# Patient Record
Sex: Female | Born: 1935 | Race: Black or African American | Hispanic: No | Marital: Married
Health system: Southern US, Community
[De-identification: ages and names within clinical notes are randomized; demographics above are authoritative.]

---

## 1999-11-28 ENCOUNTER — Encounter: Payer: Self-pay | Admitting: Internal Medicine

## 1999-11-28 ENCOUNTER — Encounter: Admission: RE | Admit: 1999-11-28 | Discharge: 1999-11-28 | Payer: Self-pay | Admitting: Internal Medicine

## 2000-12-08 ENCOUNTER — Encounter: Admission: RE | Admit: 2000-12-08 | Discharge: 2000-12-08 | Payer: Self-pay | Admitting: Internal Medicine

## 2000-12-08 ENCOUNTER — Encounter: Payer: Self-pay | Admitting: Internal Medicine

## 2000-12-09 ENCOUNTER — Encounter: Admission: RE | Admit: 2000-12-09 | Discharge: 2000-12-09 | Payer: Self-pay | Admitting: Internal Medicine

## 2000-12-09 ENCOUNTER — Encounter: Payer: Self-pay | Admitting: Internal Medicine

## 2000-12-19 ENCOUNTER — Encounter: Admission: RE | Admit: 2000-12-19 | Discharge: 2000-12-19 | Payer: Self-pay | Admitting: Internal Medicine

## 2000-12-19 ENCOUNTER — Encounter: Payer: Self-pay | Admitting: Internal Medicine

## 2001-01-13 ENCOUNTER — Encounter (INDEPENDENT_AMBULATORY_CARE_PROVIDER_SITE_OTHER): Payer: Self-pay

## 2001-01-13 ENCOUNTER — Ambulatory Visit (HOSPITAL_COMMUNITY): Admission: RE | Admit: 2001-01-13 | Discharge: 2001-01-13 | Payer: Self-pay | Admitting: *Deleted

## 2001-12-17 ENCOUNTER — Encounter: Admission: RE | Admit: 2001-12-17 | Discharge: 2001-12-17 | Payer: Self-pay | Admitting: Internal Medicine

## 2001-12-17 ENCOUNTER — Encounter: Payer: Self-pay | Admitting: Internal Medicine

## 2003-01-26 ENCOUNTER — Other Ambulatory Visit: Admission: RE | Admit: 2003-01-26 | Discharge: 2003-01-26 | Payer: Self-pay | Admitting: Internal Medicine

## 2003-01-28 ENCOUNTER — Encounter: Admission: RE | Admit: 2003-01-28 | Discharge: 2003-01-28 | Payer: Self-pay | Admitting: Internal Medicine

## 2003-01-28 ENCOUNTER — Encounter: Payer: Self-pay | Admitting: Internal Medicine

## 2004-02-01 ENCOUNTER — Ambulatory Visit (HOSPITAL_COMMUNITY): Admission: RE | Admit: 2004-02-01 | Discharge: 2004-02-01 | Payer: Self-pay | Admitting: Internal Medicine

## 2004-02-07 ENCOUNTER — Encounter: Admission: RE | Admit: 2004-02-07 | Discharge: 2004-02-07 | Payer: Self-pay | Admitting: Internal Medicine

## 2004-02-07 ENCOUNTER — Encounter (INDEPENDENT_AMBULATORY_CARE_PROVIDER_SITE_OTHER): Payer: Self-pay | Admitting: Specialist

## 2004-12-12 ENCOUNTER — Encounter (INDEPENDENT_AMBULATORY_CARE_PROVIDER_SITE_OTHER): Payer: Self-pay | Admitting: Specialist

## 2004-12-12 ENCOUNTER — Ambulatory Visit (HOSPITAL_COMMUNITY): Admission: RE | Admit: 2004-12-12 | Discharge: 2004-12-12 | Payer: Self-pay | Admitting: Internal Medicine

## 2004-12-18 ENCOUNTER — Encounter: Admission: RE | Admit: 2004-12-18 | Discharge: 2004-12-18 | Payer: Self-pay | Admitting: Internal Medicine

## 2005-03-06 ENCOUNTER — Ambulatory Visit (HOSPITAL_COMMUNITY): Admission: RE | Admit: 2005-03-06 | Discharge: 2005-03-06 | Payer: Self-pay | Admitting: Internal Medicine

## 2005-06-19 ENCOUNTER — Ambulatory Visit (HOSPITAL_COMMUNITY): Admission: RE | Admit: 2005-06-19 | Discharge: 2005-06-19 | Payer: Self-pay | Admitting: Gastroenterology

## 2006-03-31 ENCOUNTER — Ambulatory Visit (HOSPITAL_COMMUNITY): Admission: RE | Admit: 2006-03-31 | Discharge: 2006-03-31 | Payer: Self-pay | Admitting: Internal Medicine

## 2007-04-14 ENCOUNTER — Ambulatory Visit (HOSPITAL_COMMUNITY): Admission: RE | Admit: 2007-04-14 | Discharge: 2007-04-14 | Payer: Self-pay | Admitting: Internal Medicine

## 2008-04-20 ENCOUNTER — Ambulatory Visit (HOSPITAL_COMMUNITY): Admission: RE | Admit: 2008-04-20 | Discharge: 2008-04-20 | Payer: Self-pay | Admitting: Internal Medicine

## 2008-04-29 ENCOUNTER — Encounter: Admission: RE | Admit: 2008-04-29 | Discharge: 2008-04-29 | Payer: Self-pay | Admitting: Internal Medicine

## 2009-04-25 ENCOUNTER — Ambulatory Visit (HOSPITAL_COMMUNITY): Admission: RE | Admit: 2009-04-25 | Discharge: 2009-04-25 | Payer: Self-pay | Admitting: Internal Medicine

## 2010-04-27 ENCOUNTER — Ambulatory Visit (HOSPITAL_COMMUNITY): Admission: RE | Admit: 2010-04-27 | Discharge: 2010-04-27 | Payer: Self-pay | Admitting: Internal Medicine

## 2011-03-29 NOTE — Op Note (Signed)
[name], [name]                 ACCOUNT NO.:  [id number]   MEDICAL RECORD NO.:  [id number]          PATIENT TYPE:  AMB   LOCATION:  ENDO                         FACILITY:  [facility]   PHYSICIAN:  Danise Edge, M.D.   DATE OF BIRTH:  May 09, 1927   DATE OF PROCEDURE:  06/19/2005  DATE OF DISCHARGE:                                 OPERATIVE REPORT   PROCEDURE:  Screening colonoscopy.   REFERRING PHYSICIAN:  Dr. Kirby Funk.   PROCEDURE INDICATIONS:  Ms. Emily Tran is a 77 year old female born Mar 28, 1927. Emily Tran is scheduled to undergo her first screening colonoscopy  with polypectomy to prevent colon cancer. I discussed with her the  complications associated with colonoscopy and polypectomy including a  15/1000 risk of bleeding and 11/998 risk of colon perforation. Emily Tran  has signed the operative permit. Her 2001 health maintenance flexible  proctosigmoidoscopy revealed left colonic diverticulosis but no neoplasia.   ENDOSCOPIST:  Danise Edge, M.D.   PREMEDICATION:  Versed 6 mg, Demerol 60 mg.   DESCRIPTION OF PROCEDURE:  After obtaining informed consent, Emily Tran was  placed in the left lateral decubitus position. I administered intravenous  Demerol and intravenous Versed to achieve conscious sedation for the  procedure. The patient's blood pressure, oxygen saturation and cardiac  rhythm were monitored throughout the procedure and documented in the medical  record.   Anal inspection and digital rectal exam were normal. The Olympus adjustable  pediatric colonoscope was introduced into the rectum and advanced to the  cecum. A normal-appearing ileocecal valve and appendiceal orifice were  identified. Colonic preparation for the exam today was excellent.   RECTUM:  Normal. I was unable to perform a retroflexed view of the distal  rectum.  SIGMOID COLON AND DESCENDING COLON:  Extensive left colonic diverticulosis.  SPLENIC FLEXURE:  Normal.  TRANSVERSE COLON:   Normal.  HEPATIC FLEXURE:  Normal.  ASCENDING COLON:  Normal.  CECUM AND ILEOCECAL VALVE:  Normal.   ASSESSMENT:  Normal screening proctocolonoscopy to the cecum. No endoscopic  evidence for the presence of colorectal neoplasia. Extensive left colonic  diverticulosis present.       MJ/MEDQ  D:  06/19/2005  T:  06/19/2005  Job:  [id number]   cc:   Thora Lance, M.D.  [address]  [place]  [place] [address]  Fax: [phone]

## 2011-03-29 NOTE — Op Note (Signed)
[place]. [facility]  Patient:    Emily Tran, Emily Tran                        MRN: [id number] Proc. Date: 01/13/01 Adm. Date:  [id number] Attending:  Stephenie Acres CC:         Modesta Messing, M.D.   Operative Report  PREOPERATIVE DIAGNOSIS:  Right breast mass.  POSTOPERATIVE DIAGNOSIS:  Right breast mass.  PROCEDURE:  Excisional right breast biopsy.  SURGEON:  Earna Coder, M.D.  ANESTHESIA:  Local MAC.  DESCRIPTION OF PROCEDURE:  The patient was taken to the operating room and placed in a supine position.  After adequate MAC anesthesia was introduced, the right breast was prepped and draped in the normal sterile fashion.  The 3 oclock region of the right breast overlying the mass was anesthetized, the skin and subcutaneous tissue, with 1% lidocaine with bicarbonate.  A curvilinear incision was made, dissected down onto the mass, which was fibrous and fatty in nature.  It was delivered up into the wound and excised in its entirety.  Adequate hemostasis of the cavity was ensured.  The skin was closed with subcuticular 4-0 Monocryl, Steri-Strips, sterile dressings were applied. The patient tolerated the procedure well and went to PACU in good condition. DD:  01/13/01 TD:  01/14/01 Job: [id number]

## 2011-04-05 ENCOUNTER — Other Ambulatory Visit (HOSPITAL_COMMUNITY): Payer: Self-pay | Admitting: Internal Medicine

## 2011-04-05 DIAGNOSIS — Z1231 Encounter for screening mammogram for malignant neoplasm of breast: Secondary | ICD-10-CM

## 2011-04-29 ENCOUNTER — Ambulatory Visit (HOSPITAL_COMMUNITY)
Admission: RE | Admit: 2011-04-29 | Discharge: 2011-04-29 | Disposition: A | Discharge status: Home / Self Care | Payer: Medicare Other | Source: Ambulatory Visit | Attending: Internal Medicine | Admitting: Internal Medicine

## 2011-04-29 DIAGNOSIS — Z1231 Encounter for screening mammogram for malignant neoplasm of breast: Secondary | ICD-10-CM

## 2012-04-13 ENCOUNTER — Other Ambulatory Visit (HOSPITAL_COMMUNITY): Payer: Self-pay | Admitting: Internal Medicine

## 2012-04-13 DIAGNOSIS — Z1231 Encounter for screening mammogram for malignant neoplasm of breast: Secondary | ICD-10-CM

## 2012-05-05 ENCOUNTER — Ambulatory Visit (HOSPITAL_COMMUNITY)
Admission: RE | Admit: 2012-05-05 | Discharge: 2012-05-05 | Disposition: A | Discharge status: Home / Self Care | Payer: Medicare Other | Source: Ambulatory Visit | Attending: Internal Medicine | Admitting: Internal Medicine

## 2012-05-05 DIAGNOSIS — Z1231 Encounter for screening mammogram for malignant neoplasm of breast: Secondary | ICD-10-CM

## 2013-04-09 ENCOUNTER — Other Ambulatory Visit (HOSPITAL_COMMUNITY): Payer: Self-pay | Admitting: Family Medicine

## 2013-04-09 DIAGNOSIS — Z1231 Encounter for screening mammogram for malignant neoplasm of breast: Secondary | ICD-10-CM

## 2013-05-06 ENCOUNTER — Ambulatory Visit (HOSPITAL_COMMUNITY)
Admission: RE | Admit: 2013-05-06 | Discharge: 2013-05-06 | Disposition: A | Discharge status: Home / Self Care | Payer: Medicare Other | Source: Ambulatory Visit | Attending: Family Medicine | Admitting: Family Medicine

## 2013-05-06 DIAGNOSIS — Z1231 Encounter for screening mammogram for malignant neoplasm of breast: Secondary | ICD-10-CM | POA: Insufficient documentation

## 2013-07-27 ENCOUNTER — Other Ambulatory Visit: Payer: Self-pay | Admitting: Family Medicine

## 2013-07-27 DIAGNOSIS — F0390 Unspecified dementia without behavioral disturbance: Secondary | ICD-10-CM

## 2013-08-05 ENCOUNTER — Ambulatory Visit
Admission: RE | Admit: 2013-08-05 | Discharge: 2013-08-05 | Disposition: A | Discharge status: Home / Self Care | Payer: Medicare Other | Source: Ambulatory Visit | Attending: Family Medicine | Admitting: Family Medicine

## 2013-08-05 DIAGNOSIS — F0390 Unspecified dementia without behavioral disturbance: Secondary | ICD-10-CM

## 2014-04-06 ENCOUNTER — Other Ambulatory Visit (HOSPITAL_COMMUNITY): Payer: Self-pay | Admitting: Family Medicine

## 2014-04-06 DIAGNOSIS — Z1231 Encounter for screening mammogram for malignant neoplasm of breast: Secondary | ICD-10-CM

## 2014-05-09 ENCOUNTER — Ambulatory Visit (HOSPITAL_COMMUNITY)
Admission: RE | Admit: 2014-05-09 | Discharge: 2014-05-09 | Disposition: A | Discharge status: Home / Self Care | Payer: Medicare Other | Source: Ambulatory Visit | Attending: Family Medicine | Admitting: Family Medicine

## 2014-05-09 DIAGNOSIS — Z1231 Encounter for screening mammogram for malignant neoplasm of breast: Secondary | ICD-10-CM | POA: Insufficient documentation

## 2015-04-21 ENCOUNTER — Other Ambulatory Visit (HOSPITAL_COMMUNITY): Payer: Self-pay | Admitting: Family Medicine

## 2015-04-21 DIAGNOSIS — Z1231 Encounter for screening mammogram for malignant neoplasm of breast: Secondary | ICD-10-CM

## 2015-05-12 ENCOUNTER — Ambulatory Visit (HOSPITAL_COMMUNITY)
Admission: RE | Admit: 2015-05-12 | Discharge: 2015-05-12 | Disposition: A | Discharge status: Home / Self Care | Payer: Medicare Other | Source: Ambulatory Visit | Attending: Family Medicine | Admitting: Family Medicine

## 2015-05-12 DIAGNOSIS — Z1231 Encounter for screening mammogram for malignant neoplasm of breast: Secondary | ICD-10-CM | POA: Diagnosis present

## 2015-05-16 ENCOUNTER — Other Ambulatory Visit: Payer: Self-pay | Admitting: Family Medicine

## 2015-05-16 DIAGNOSIS — R928 Other abnormal and inconclusive findings on diagnostic imaging of breast: Secondary | ICD-10-CM

## 2015-05-23 ENCOUNTER — Other Ambulatory Visit: Payer: Self-pay | Admitting: Family Medicine

## 2015-05-23 ENCOUNTER — Ambulatory Visit
Admission: RE | Admit: 2015-05-23 | Discharge: 2015-05-23 | Disposition: A | Discharge status: Home / Self Care | Payer: Medicare Other | Source: Ambulatory Visit | Attending: Family Medicine | Admitting: Family Medicine

## 2015-05-23 DIAGNOSIS — R928 Other abnormal and inconclusive findings on diagnostic imaging of breast: Secondary | ICD-10-CM

## 2015-05-30 ENCOUNTER — Ambulatory Visit
Admission: RE | Admit: 2015-05-30 | Discharge: 2015-05-30 | Disposition: A | Discharge status: Home / Self Care | Payer: Medicare Other | Source: Ambulatory Visit | Attending: Family Medicine | Admitting: Family Medicine

## 2015-05-30 ENCOUNTER — Other Ambulatory Visit: Payer: Self-pay | Admitting: Family Medicine

## 2015-05-30 DIAGNOSIS — R928 Other abnormal and inconclusive findings on diagnostic imaging of breast: Secondary | ICD-10-CM

## 2016-05-21 ENCOUNTER — Other Ambulatory Visit: Payer: Self-pay | Admitting: Family Medicine

## 2016-05-21 DIAGNOSIS — Z1231 Encounter for screening mammogram for malignant neoplasm of breast: Secondary | ICD-10-CM

## 2016-05-30 ENCOUNTER — Ambulatory Visit
Admission: RE | Admit: 2016-05-30 | Discharge: 2016-05-30 | Disposition: A | Discharge status: Home / Self Care | Payer: Medicare Other | Source: Ambulatory Visit | Attending: Family Medicine | Admitting: Family Medicine

## 2016-05-30 DIAGNOSIS — Z1231 Encounter for screening mammogram for malignant neoplasm of breast: Secondary | ICD-10-CM

## 2017-02-14 ENCOUNTER — Ambulatory Visit: Payer: Self-pay | Admitting: Podiatry

## 2017-02-20 ENCOUNTER — Ambulatory Visit (INDEPENDENT_AMBULATORY_CARE_PROVIDER_SITE_OTHER): Payer: Medicare Other | Admitting: Podiatry

## 2017-02-20 ENCOUNTER — Encounter: Payer: Self-pay | Admitting: Podiatry

## 2017-02-20 VITALS — Resp 16 | Ht 69.0 in

## 2017-02-20 DIAGNOSIS — B351 Tinea unguium: Secondary | ICD-10-CM | POA: Diagnosis not present

## 2017-02-20 DIAGNOSIS — M79675 Pain in left toe(s): Secondary | ICD-10-CM | POA: Diagnosis not present

## 2017-02-20 DIAGNOSIS — M79674 Pain in right toe(s): Secondary | ICD-10-CM | POA: Diagnosis not present

## 2017-02-20 NOTE — Progress Notes (Signed)
   Subjective:    Patient ID: Emily Tran, female    DOB: July 03, 1927, 83 y.o.   MRN: [id number]  HPI  Chief Complaint  Patient presents with  . Tinea Pedis    BL; concerned about fungus in between toes.  . Nail Problem    Thick; Dark/Discolored nails.    83 year old female presents the office with concerns of thick, painful, elongated toenails that she cannot trim herself. She has also noticed that the nails become dark in color mostly over the right fifth toe. Denies any redness or drainage or swelling around the toenail size. She's had no recent treatment for this. She denies any claudication symptoms. No numbness or tingling. No other complaints at this time.  Review of Systems  All other systems reviewed and are negative.      Objective:   Physical Exam General: AAO x3, NAD  Dermatological: Nails are hypertrophic, dystrophic, brittle, discolored, elongated 10. There is yellow-brown discoloration the nails have the right fifth digit toenail distally to be darker in color. There is no extension of any hyperpigmentation into the surrounding skin. Upon debridement there is no underlying hyperpigmentation in the nailbed. No surrounding redness or drainage. Tenderness nails 1-5 bilaterally. No open lesions or pre-ulcerative lesions are identified today.   Vascular: Dorsalis Pedis artery and Posterior Tibial artery pedal pulses are 2/4 bilateral with immedate capillary fill time. There is no pain with calf compression, swelling, warmth, erythema.   Neruologic: Grossly intact via light touch bilateral. Vibratory intact via tuning fork bilateral. Protective threshold with Semmes Wienstein monofilament intact to all pedal sites bilateral.  Musculoskeletal: No gross boney pedal deformities bilateral. No pain, crepitus, or limitation noted with foot and ankle range of motion bilateral.     Assessment & Plan:  83 year old female with symptomatic onychomycosis -Treatment options discussed  including all alternatives, risks, and complications -Etiology of symptoms were discussed -Nails debrided 10 without complications or bleeding. -I did send the fifth digit toenail on the right side for pathology/culture. -Daily foot inspection -Follow-up in 3 months or sooner if any problems arise. In the meantime, encouraged to call the office with any questions, concerns, change in symptoms.   Ovid Curd, DPM

## 2017-03-10 ENCOUNTER — Telehealth: Payer: Self-pay | Admitting: *Deleted

## 2017-03-10 NOTE — Telephone Encounter (Addendum)
-----   Message from Vivi Barrack, DPM sent at 03/10/2017  7:55 AM EDT ----- Go ahead and please order the shertech topical onychomycosis ointment. Thanks.Left message requesting call back for culture results and instructions.03/12/2017-Pt's son, Arlys John called to get information concerning my call with results. Arlys John states he would like to try but pt has dementia and it is difficult to perform any service for her. I gave Arlys John the [employer] [phone] and told him they would call and explain cost and delivery, that if he found it was too difficult to perform it was not a life or death illness and she could go without it. Arlys John states understanding.

## 2017-03-12 MED ORDER — NONFORMULARY OR COMPOUNDED ITEM: 2 refills | Status: AC

## 2017-05-15 ENCOUNTER — Other Ambulatory Visit: Payer: Self-pay | Admitting: Family Medicine

## 2017-05-15 DIAGNOSIS — Z1231 Encounter for screening mammogram for malignant neoplasm of breast: Secondary | ICD-10-CM

## 2017-05-23 ENCOUNTER — Ambulatory Visit (INDEPENDENT_AMBULATORY_CARE_PROVIDER_SITE_OTHER): Payer: Medicare Other | Admitting: Podiatry

## 2017-05-23 ENCOUNTER — Encounter: Payer: Self-pay | Admitting: Podiatry

## 2017-05-23 DIAGNOSIS — M79674 Pain in right toe(s): Secondary | ICD-10-CM | POA: Diagnosis not present

## 2017-05-23 DIAGNOSIS — B351 Tinea unguium: Secondary | ICD-10-CM

## 2017-05-23 DIAGNOSIS — M79675 Pain in left toe(s): Secondary | ICD-10-CM | POA: Diagnosis not present

## 2017-05-26 NOTE — Progress Notes (Signed)
Subjective: 83 y.o. returns the office today for painful, elongated, thickened toenails which she cannot trim herself. Denies any redness or drainage around the nails. She has been using a topical antifungal that was ordered through [facility]. Denies any acute changes since last appointment and no new complaints today. Denies any systemic complaints such as fevers, chills, nausea, vomiting.   Objective: AAO 3, NAD DP/PT pulses palpable, CRT less than 3 seconds Nails hypertrophic, dystrophic, elongated, brittle, discolored 10. There is tenderness overlying the nails 1-5 bilaterally. There is no surrounding erythema or drainage along the nail sites. No open lesions or pre-ulcerative lesions are identified. No other areas of tenderness bilateral lower extremities. No overlying edema, erythema, increased warmth. No pain with calf compression, swelling, warmth, erythema.  Assessment: Patient presents with symptomatic onychomycosis  Plan: -Treatment options including alternatives, risks, complications were discussed -Nails sharply debrided 10 without complication/bleeding. -Continue a topical antifungal treatment -Discussed daily foot inspection. If there are any changes, to call the office immediately.  -Follow-up in 3 months or sooner if any problems are to arise. In the meantime, encouraged to call the office with any questions, concerns, changes symptoms.  Ovid CurdMatthew Wagoner, DPM

## 2017-06-02 ENCOUNTER — Ambulatory Visit
Admission: RE | Admit: 2017-06-02 | Discharge: 2017-06-02 | Disposition: A | Discharge status: Home / Self Care | Payer: Medicare Other | Source: Ambulatory Visit | Attending: Family Medicine | Admitting: Family Medicine

## 2017-06-02 DIAGNOSIS — Z1231 Encounter for screening mammogram for malignant neoplasm of breast: Secondary | ICD-10-CM

## 2017-08-15 ENCOUNTER — Telehealth: Payer: Self-pay | Admitting: Podiatry

## 2017-08-15 MED ORDER — NONFORMULARY OR COMPOUNDED ITEM: 11 refills | Status: AC

## 2017-08-15 NOTE — Telephone Encounter (Signed)
needs refill on medicine from [facility]

## 2017-08-15 NOTE — Addendum Note (Signed)
Addended by: Alphia Kava D on: 08/15/2017 02:57 PM   Modules accepted: Orders

## 2017-08-15 NOTE — Telephone Encounter (Addendum)
Dr. Ardelle Anton ordered refill Shertech Onychomycosis Nail Lacquer prn 1 year. Faxed refill request to [facility]. Left message informing pt's son, Arlys John of the refill orders.

## 2017-08-21 ENCOUNTER — Ambulatory Visit: Payer: Medicare Other | Admitting: Podiatry

## 2017-11-17 ENCOUNTER — Ambulatory Visit: Payer: Medicare Other | Admitting: Podiatry

## 2018-11-25 ENCOUNTER — Other Ambulatory Visit: Payer: Self-pay | Admitting: Family Medicine

## 2018-11-25 DIAGNOSIS — R5381 Other malaise: Secondary | ICD-10-CM

## 2018-11-26 ENCOUNTER — Other Ambulatory Visit: Payer: Self-pay | Admitting: Family Medicine

## 2018-11-26 DIAGNOSIS — M8580 Other specified disorders of bone density and structure, unspecified site: Secondary | ICD-10-CM

## 2019-01-25 ENCOUNTER — Other Ambulatory Visit: Payer: Self-pay | Admitting: Family Medicine

## 2019-01-25 DIAGNOSIS — Z1231 Encounter for screening mammogram for malignant neoplasm of breast: Secondary | ICD-10-CM

## 2019-01-28 ENCOUNTER — Other Ambulatory Visit: Payer: Medicare Other

## 2019-03-23 ENCOUNTER — Other Ambulatory Visit: Payer: Medicare Other

## 2019-04-29 ENCOUNTER — Ambulatory Visit
Admission: RE | Admit: 2019-04-29 | Discharge: 2019-04-29 | Disposition: A | Discharge status: Home / Self Care | Payer: Medicare Other | Source: Ambulatory Visit | Attending: Family Medicine | Admitting: Family Medicine

## 2019-04-29 ENCOUNTER — Other Ambulatory Visit: Payer: Self-pay

## 2019-04-29 DIAGNOSIS — M8580 Other specified disorders of bone density and structure, unspecified site: Secondary | ICD-10-CM

## 2019-04-29 DIAGNOSIS — Z1231 Encounter for screening mammogram for malignant neoplasm of breast: Secondary | ICD-10-CM

## 2019-05-03 ENCOUNTER — Ambulatory Visit: Payer: Medicare Other | Admitting: Podiatry

## 2019-05-03 ENCOUNTER — Other Ambulatory Visit: Payer: Self-pay

## 2019-05-03 DIAGNOSIS — M79674 Pain in right toe(s): Secondary | ICD-10-CM

## 2019-05-03 DIAGNOSIS — B351 Tinea unguium: Secondary | ICD-10-CM | POA: Diagnosis not present

## 2019-05-03 DIAGNOSIS — M79675 Pain in left toe(s): Secondary | ICD-10-CM

## 2019-05-03 MED ORDER — NAFTIFINE HCL 1 % EX CREA: TOPICAL_CREAM | Freq: Every day | CUTANEOUS | 0 refills | Status: DC

## 2019-05-04 NOTE — Progress Notes (Signed)
Subjective: 85 y.o. returns the office today for painful, elongated, thickened toenails which she cannot trim herself.  She was using a topical compound ointment for nail fungus however was not overly helpful.  Her son is requesting a refill of Naftin that he brought into the office today because she does get some athlete's foot that he would like to have a refill. Denies any systemic complaints such as fevers, chills, nausea, vomiting.   Objective: AAO 3, NAD DP/PT pulses palpable, CRT less than 3 seconds Nails hypertrophic, dystrophic, elongated, brittle, discolored 10. There is tenderness overlying the nails 1-5 bilaterally. There is no surrounding erythema or drainage along the nail sites. Very minimal tinea pedis is present interdigitally.  There is no open sores there is no drainage or pus or any signs of a bacterial infection. No open lesions or pre-ulcerative lesions are identified. No pain with calf compression, swelling, warmth, erythema.  Assessment: Patient presents with symptomatic onychomycosis  Plan: -Treatment options including alternatives, risks, complications were discussed -Nails sharply debrided 10 without complication/bleeding. -Prescribed Naftin for him. -Discussed daily foot inspection. If there are any changes, to call the office immediately.  -Follow-up in 3 months or sooner if any problems are to arise. In the meantime, encouraged to call the office with any questions, concerns, changes symptoms.  Celesta Gentile, DPM

## 2019-08-02 ENCOUNTER — Ambulatory Visit (INDEPENDENT_AMBULATORY_CARE_PROVIDER_SITE_OTHER): Payer: Medicare Other | Admitting: Podiatry

## 2019-08-02 ENCOUNTER — Encounter: Payer: Self-pay | Admitting: Podiatry

## 2019-08-02 ENCOUNTER — Other Ambulatory Visit: Payer: Self-pay

## 2019-08-02 DIAGNOSIS — B351 Tinea unguium: Secondary | ICD-10-CM | POA: Diagnosis not present

## 2019-08-02 DIAGNOSIS — M79675 Pain in left toe(s): Secondary | ICD-10-CM

## 2019-08-02 DIAGNOSIS — Q828 Other specified congenital malformations of skin: Secondary | ICD-10-CM

## 2019-08-02 DIAGNOSIS — M79674 Pain in right toe(s): Secondary | ICD-10-CM | POA: Diagnosis not present

## 2019-08-02 MED ORDER — NAFTIFINE HCL 1 % EX CREA: TOPICAL_CREAM | Freq: Every day | CUTANEOUS | 2 refills | Status: DC

## 2019-08-03 NOTE — Progress Notes (Signed)
Subjective: 85 y.o. returns the office today for painful, elongated, thickened toenails which she cannot trim herself as well as for calluses to both of her feet.  Denies any redness or drainage from the callus or toenail sites.  They are asking for refill of Naftin as this has been helpful for her.  She gets occasional athlete's foot.  Denies any skin breakdown or open sores or any pustules or blistering.  Objective: AAO 3, NAD DP/PT pulses palpable, CRT less than 3 seconds Nails hypertrophic, dystrophic, elongated, brittle, discolored 10. There is tenderness overlying the nails 1-5 bilaterally. There is no surrounding erythema or drainage along the nail sites. Hyperkeratotic lesion submetatarsal bilaterally x4 total.  No known ulceration drainage or any signs of infection. Tinea pedis appears to be resolved No open lesions or pre-ulcerative lesions are identified. No pain with calf compression, swelling, warmth, erythema.  Assessment: Patient presents with symptomatic onychomycosis; hyperkeratotic lesions  Plan: -Treatment options including alternatives, risks, complications were discussed -Nails sharply debrided 10 without complication/bleeding. -Hyperkeratotic lesions debrided x4 without any complications or bleeding -Refilled Naftin cream. -Discussed daily foot inspection. If there are any changes, to call the office immediately.  -Follow-up in 3 months or sooner if any problems are to arise. In the meantime, encouraged to call the office with any questions, concerns, changes symptoms.  Celesta Gentile, DPM

## 2019-11-01 ENCOUNTER — Ambulatory Visit: Payer: Medicare Other | Admitting: Podiatry

## 2019-11-01 ENCOUNTER — Other Ambulatory Visit: Payer: Self-pay

## 2019-11-01 DIAGNOSIS — B351 Tinea unguium: Secondary | ICD-10-CM

## 2019-11-01 DIAGNOSIS — M79674 Pain in right toe(s): Secondary | ICD-10-CM

## 2019-11-01 DIAGNOSIS — M79675 Pain in left toe(s): Secondary | ICD-10-CM

## 2019-11-01 DIAGNOSIS — Q828 Other specified congenital malformations of skin: Secondary | ICD-10-CM | POA: Diagnosis not present

## 2019-11-01 NOTE — Patient Instructions (Signed)
Look at getting Eucerin or Cetaphil cream for the feet. Do not apply between the toes.

## 2019-11-07 NOTE — Progress Notes (Signed)
Subjective: 85 y.o. returns the office today for painful, elongated, thickened toenails which she cannot trim herself as well as for calluses to both of her feet.  Denies any skin breakdown or open sores or any pustules or blistering.  Objective: AAO 3, NAD DP/PT pulses palpable, CRT less than 3 seconds Nails hypertrophic, dystrophic, elongated, brittle, discolored 10. There is tenderness overlying the nails 1-5 bilaterally. There is no surrounding erythema or drainage along the nail sites. Hyperkeratotic lesion submetatarsal bilaterally x4 total.  No known ulceration drainage or any signs of infection. Tinea pedis appears to be resolved No open lesions or pre-ulcerative lesions are identified. No pain with calf compression, swelling, warmth, erythema.  Assessment: Patient presents with symptomatic onychomycosis; hyperkeratotic lesions  Plan: -Treatment options including alternatives, risks, complications were discussed -Nails sharply debrided 10 without complication/bleeding. -Hyperkeratotic lesions debrided x4 without any complications or bleeding -She does have dry skin without any skin fissures.  Recommend Eucerin instead of her moisturizer but not apply interdigitally. -Discussed daily foot inspection. If there are any changes, to call the office immediately.  -Follow-up in 3 months or sooner if any problems are to arise. In the meantime, encouraged to call the office with any questions, concerns, changes symptoms.  Celesta Gentile, DPM

## 2020-01-31 ENCOUNTER — Ambulatory Visit: Payer: Medicare Other | Admitting: Podiatry

## 2020-01-31 ENCOUNTER — Other Ambulatory Visit: Payer: Self-pay

## 2020-01-31 ENCOUNTER — Encounter: Payer: Self-pay | Admitting: Podiatry

## 2020-01-31 VITALS — Temp 97.7°F

## 2020-01-31 DIAGNOSIS — B351 Tinea unguium: Secondary | ICD-10-CM

## 2020-01-31 DIAGNOSIS — Q828 Other specified congenital malformations of skin: Secondary | ICD-10-CM

## 2020-01-31 DIAGNOSIS — M79675 Pain in left toe(s): Secondary | ICD-10-CM | POA: Diagnosis not present

## 2020-01-31 DIAGNOSIS — M79674 Pain in right toe(s): Secondary | ICD-10-CM

## 2020-01-31 MED ORDER — NAFTIFINE HCL 1 % EX CREA: TOPICAL_CREAM | Freq: Every day | CUTANEOUS | 2 refills | Status: DC

## 2020-02-01 NOTE — Progress Notes (Signed)
Subjective: 86 y.o. returns the office today for painful, elongated, thickened toenails which she cannot trim herself as well as for calluses to both of her feet.  Denies any skin breakdown or open sores or any pustules or blistering.  Objective: AAO 3, NAD DP/PT pulses palpable, CRT less than 3 seconds Nails hypertrophic, dystrophic, elongated, brittle, discolored 10. There is tenderness overlying the nails 1-5 bilaterally. There is no surrounding erythema or drainage along the nail sites. Hyperkeratotic lesion submetatarsal bilaterally x4 total.  No known ulceration drainage or any signs of infection. Prominent metatarsal heads plantarly.  Hammertoe contractures present. No open lesions or pre-ulcerative lesions are identified. No pain with calf compression, swelling, warmth, erythema.  Assessment: Patient presents with symptomatic onychomycosis; hyperkeratotic lesions  Plan: -Treatment options including alternatives, risks, complications were discussed -Nails sharply debrided 10 without complication/bleeding. -Hyperkeratotic lesions debrided x4 without any complications or bleeding -Asking for refill Naftin to have on hand.  I did refill this. -Discussed daily foot inspection. If there are any changes, to call the office immediately.  -Follow-up in 3 months or sooner if any problems are to arise. In the meantime, encouraged to call the office with any questions, concerns, changes symptoms.  Ovid Curd, DPM

## 2020-03-27 ENCOUNTER — Other Ambulatory Visit: Payer: Self-pay | Admitting: Family Medicine

## 2020-03-27 DIAGNOSIS — Z1231 Encounter for screening mammogram for malignant neoplasm of breast: Secondary | ICD-10-CM

## 2020-05-01 ENCOUNTER — Other Ambulatory Visit: Payer: Self-pay

## 2020-05-01 ENCOUNTER — Ambulatory Visit
Admission: RE | Admit: 2020-05-01 | Discharge: 2020-05-01 | Disposition: A | Discharge status: Home / Self Care | Payer: Medicare Other | Source: Ambulatory Visit | Attending: Family Medicine | Admitting: Family Medicine

## 2020-05-01 DIAGNOSIS — Z1231 Encounter for screening mammogram for malignant neoplasm of breast: Secondary | ICD-10-CM

## 2020-05-02 ENCOUNTER — Ambulatory Visit: Payer: Medicare Other | Admitting: Podiatry

## 2020-07-10 ENCOUNTER — Encounter: Payer: Self-pay | Admitting: Podiatry

## 2020-07-10 ENCOUNTER — Other Ambulatory Visit: Payer: Self-pay

## 2020-07-10 ENCOUNTER — Ambulatory Visit: Payer: Medicare Other | Admitting: Podiatry

## 2020-07-10 DIAGNOSIS — B351 Tinea unguium: Secondary | ICD-10-CM | POA: Diagnosis not present

## 2020-07-10 DIAGNOSIS — M79674 Pain in right toe(s): Secondary | ICD-10-CM

## 2020-07-10 DIAGNOSIS — Q828 Other specified congenital malformations of skin: Secondary | ICD-10-CM

## 2020-07-10 DIAGNOSIS — M79675 Pain in left toe(s): Secondary | ICD-10-CM

## 2020-07-11 NOTE — Progress Notes (Signed)
Subjective: 86 y.o. returns the office today for painful, elongated, thickened toenails which she cannot trim herself as well as for calluses to both of her feet.  Her son states the calluses in the right foot to become thicker.  They have been causing more discomfort.  Denies any skin breakdown or open sores or any pustules or blistering.  Objective: NAD DP/PT pulses palpable, CRT less than 3 seconds Nails hypertrophic, dystrophic, elongated, brittle, discolored 10. There is tenderness overlying the nails 1-5 bilaterally. There is no surrounding erythema or drainage along the nail sites. Hyperkeratotic lesion submetatarsal bilaterally x4 total.  No known ulceration drainage or any signs of infection. Prominent metatarsal heads plantarly.  Hammertoe contractures present. No open lesions or pre-ulcerative lesions are identified. No pain with calf compression, swelling, warmth, erythema.  Assessment: Patient presents with symptomatic onychomycosis; hyperkeratotic lesions  Plan: -Treatment options including alternatives, risks, complications were discussed -Nails sharply debrided 10 without complication/bleeding. -Hyperkeratotic lesions debrided x4 without any complications or bleeding -Discussed daily foot inspection. If there are any changes, to call the office immediately.  -Follow-up in 3 months or sooner if any problems are to arise. In the meantime, encouraged to call the office with any questions, concerns, changes symptoms.  Ovid Curd, DPM

## 2020-10-12 ENCOUNTER — Ambulatory Visit: Payer: Medicare Other | Admitting: Podiatry

## 2020-10-12 ENCOUNTER — Other Ambulatory Visit: Payer: Self-pay

## 2020-10-12 DIAGNOSIS — B351 Tinea unguium: Secondary | ICD-10-CM

## 2020-10-12 DIAGNOSIS — Q828 Other specified congenital malformations of skin: Secondary | ICD-10-CM

## 2020-10-12 DIAGNOSIS — M79675 Pain in left toe(s): Secondary | ICD-10-CM

## 2020-10-12 DIAGNOSIS — M79674 Pain in right toe(s): Secondary | ICD-10-CM | POA: Diagnosis not present

## 2020-10-12 NOTE — Progress Notes (Signed)
Subjective: 86 y.o. returns the office today for painful, elongated, thickened toenails which she cannot trim herself as well as for calluses to both of her feet.  Her son states the calluses in the right foot to become thicker and the right foot is more painful that the right in regards to the callus. No open lesions. Denies any skin breakdown or open sores or any pustules or blistering.  Objective: NAD DP/PT pulses palpable, CRT less than 3 seconds Nails hypertrophic, dystrophic, elongated, brittle, discolored 10. There is tenderness overlying the nails 1-5 bilaterally. There is no surrounding erythema or drainage along the nail sites. Hyperkeratotic lesion submetatarsal bilaterally x4 total.  No known ulceration drainage or any signs of infection. Prominent metatarsal heads plantarly.  Hammertoe contractures present. No open lesions or pre-ulcerative lesions are identified. No pain with calf compression, swelling, warmth, erythema.  Assessment: Patient presents with symptomatic onychomycosis; hyperkeratotic lesions  Plan: -Treatment options including alternatives, risks, complications were discussed -Nails sharply debrided 10 without complication/bleeding. -Hyperkeratotic lesions debrided x4 without any complications or bleeding. Dispensed gel metatarsal pads. Moisturizer daily.  -Discussed daily foot inspection. If there are any changes, to call the office immediately.  -Follow-up in 3 months or sooner if any problems are to arise. In the meantime, encouraged to call the office with any questions, concerns, changes symptoms.  Ovid Curd, DPM

## 2020-12-05 DIAGNOSIS — I129 Hypertensive chronic kidney disease with stage 1 through stage 4 chronic kidney disease, or unspecified chronic kidney disease: Secondary | ICD-10-CM | POA: Diagnosis not present

## 2020-12-05 DIAGNOSIS — E785 Hyperlipidemia, unspecified: Secondary | ICD-10-CM | POA: Diagnosis not present

## 2020-12-05 DIAGNOSIS — M810 Age-related osteoporosis without current pathological fracture: Secondary | ICD-10-CM | POA: Diagnosis not present

## 2020-12-05 DIAGNOSIS — N183 Chronic kidney disease, stage 3 unspecified: Secondary | ICD-10-CM | POA: Diagnosis not present

## 2020-12-05 DIAGNOSIS — I10 Essential (primary) hypertension: Secondary | ICD-10-CM | POA: Diagnosis not present

## 2020-12-11 DIAGNOSIS — J3089 Other allergic rhinitis: Secondary | ICD-10-CM | POA: Diagnosis not present

## 2021-01-08 DIAGNOSIS — E785 Hyperlipidemia, unspecified: Secondary | ICD-10-CM | POA: Diagnosis not present

## 2021-01-08 DIAGNOSIS — M810 Age-related osteoporosis without current pathological fracture: Secondary | ICD-10-CM | POA: Diagnosis not present

## 2021-01-08 DIAGNOSIS — I129 Hypertensive chronic kidney disease with stage 1 through stage 4 chronic kidney disease, or unspecified chronic kidney disease: Secondary | ICD-10-CM | POA: Diagnosis not present

## 2021-01-08 DIAGNOSIS — N183 Chronic kidney disease, stage 3 unspecified: Secondary | ICD-10-CM | POA: Diagnosis not present

## 2021-01-08 DIAGNOSIS — I10 Essential (primary) hypertension: Secondary | ICD-10-CM | POA: Diagnosis not present

## 2021-01-09 ENCOUNTER — Ambulatory Visit: Payer: Medicare Other | Admitting: Podiatry

## 2021-01-09 ENCOUNTER — Other Ambulatory Visit: Payer: Self-pay

## 2021-01-09 DIAGNOSIS — Q828 Other specified congenital malformations of skin: Secondary | ICD-10-CM

## 2021-01-09 DIAGNOSIS — M79674 Pain in right toe(s): Secondary | ICD-10-CM

## 2021-01-09 DIAGNOSIS — M79675 Pain in left toe(s): Secondary | ICD-10-CM

## 2021-01-09 DIAGNOSIS — B351 Tinea unguium: Secondary | ICD-10-CM | POA: Diagnosis not present

## 2021-01-10 NOTE — Progress Notes (Signed)
Subjective: 87 y.o. returns the office today for painful, elongated, thickened toenails which she cannot trim herself as well as for calluses to both of her feet.  She has recently purchased new shoes which have been helpful. No open lesions. Denies any skin breakdown or open sores or any pustules or blistering.  Objective: NAD DP/PT pulses palpable, CRT less than 3 seconds Nails hypertrophic, dystrophic, elongated, brittle, discolored 10. There is tenderness overlying the nails 1-5 bilaterally. There is no surrounding erythema or drainage along the nail sites. Hyperkeratotic lesion submetatarsal bilaterally x4 total.  No underlying ulceration drainage or any signs of infection. Prominent metatarsal heads plantarly.  Hammertoe contractures present. No open lesions or pre-ulcerative lesions are identified. No pain with calf compression, swelling, warmth, erythema.  Assessment: Patient presents with symptomatic onychomycosis; hyperkeratotic lesions  Plan: -Treatment options including alternatives, risks, complications were discussed -Nails sharply debrided 10 without complication/bleeding. -Hyperkeratotic lesions debrided x4 without any complications or bleeding. Dispensed gel metatarsal pads. Moisturizer daily.  -Discussed daily foot inspection. If there are any changes, to call the office immediately.  -Follow-up in 3 months or sooner if any problems are to arise. In the meantime, encouraged to call the office with any questions, concerns, changes symptoms.  Ovid Curd, DPM

## 2021-01-29 DIAGNOSIS — R400 Somnolence: Secondary | ICD-10-CM | POA: Diagnosis not present

## 2021-01-29 DIAGNOSIS — R0982 Postnasal drip: Secondary | ICD-10-CM | POA: Diagnosis not present

## 2021-02-01 DIAGNOSIS — J3089 Other allergic rhinitis: Secondary | ICD-10-CM | POA: Diagnosis not present

## 2021-02-01 DIAGNOSIS — I10 Essential (primary) hypertension: Secondary | ICD-10-CM | POA: Diagnosis not present

## 2021-02-01 DIAGNOSIS — R400 Somnolence: Secondary | ICD-10-CM | POA: Diagnosis not present

## 2021-02-01 DIAGNOSIS — H401194 Primary open-angle glaucoma, unspecified eye, indeterminate stage: Secondary | ICD-10-CM | POA: Diagnosis not present

## 2021-02-01 DIAGNOSIS — M810 Age-related osteoporosis without current pathological fracture: Secondary | ICD-10-CM | POA: Diagnosis not present

## 2021-02-01 DIAGNOSIS — E785 Hyperlipidemia, unspecified: Secondary | ICD-10-CM | POA: Diagnosis not present

## 2021-02-01 DIAGNOSIS — R739 Hyperglycemia, unspecified: Secondary | ICD-10-CM | POA: Diagnosis not present

## 2021-02-01 DIAGNOSIS — N183 Chronic kidney disease, stage 3 unspecified: Secondary | ICD-10-CM | POA: Diagnosis not present

## 2021-02-21 DIAGNOSIS — I129 Hypertensive chronic kidney disease with stage 1 through stage 4 chronic kidney disease, or unspecified chronic kidney disease: Secondary | ICD-10-CM | POA: Diagnosis not present

## 2021-02-21 DIAGNOSIS — N183 Chronic kidney disease, stage 3 unspecified: Secondary | ICD-10-CM | POA: Diagnosis not present

## 2021-02-21 DIAGNOSIS — I10 Essential (primary) hypertension: Secondary | ICD-10-CM | POA: Diagnosis not present

## 2021-02-21 DIAGNOSIS — M810 Age-related osteoporosis without current pathological fracture: Secondary | ICD-10-CM | POA: Diagnosis not present

## 2021-02-21 DIAGNOSIS — E785 Hyperlipidemia, unspecified: Secondary | ICD-10-CM | POA: Diagnosis not present

## 2021-03-01 DIAGNOSIS — J3089 Other allergic rhinitis: Secondary | ICD-10-CM | POA: Diagnosis not present

## 2021-03-01 DIAGNOSIS — R739 Hyperglycemia, unspecified: Secondary | ICD-10-CM | POA: Diagnosis not present

## 2021-03-01 DIAGNOSIS — M170 Bilateral primary osteoarthritis of knee: Secondary | ICD-10-CM | POA: Diagnosis not present

## 2021-03-01 DIAGNOSIS — E785 Hyperlipidemia, unspecified: Secondary | ICD-10-CM | POA: Diagnosis not present

## 2021-03-01 DIAGNOSIS — J301 Allergic rhinitis due to pollen: Secondary | ICD-10-CM | POA: Diagnosis not present

## 2021-03-01 DIAGNOSIS — N183 Chronic kidney disease, stage 3 unspecified: Secondary | ICD-10-CM | POA: Diagnosis not present

## 2021-03-01 DIAGNOSIS — M810 Age-related osteoporosis without current pathological fracture: Secondary | ICD-10-CM | POA: Diagnosis not present

## 2021-03-01 DIAGNOSIS — R400 Somnolence: Secondary | ICD-10-CM | POA: Diagnosis not present

## 2021-03-23 DIAGNOSIS — N183 Chronic kidney disease, stage 3 unspecified: Secondary | ICD-10-CM | POA: Diagnosis not present

## 2021-03-23 DIAGNOSIS — M170 Bilateral primary osteoarthritis of knee: Secondary | ICD-10-CM | POA: Diagnosis not present

## 2021-03-23 DIAGNOSIS — M810 Age-related osteoporosis without current pathological fracture: Secondary | ICD-10-CM | POA: Diagnosis not present

## 2021-03-23 DIAGNOSIS — I129 Hypertensive chronic kidney disease with stage 1 through stage 4 chronic kidney disease, or unspecified chronic kidney disease: Secondary | ICD-10-CM | POA: Diagnosis not present

## 2021-03-23 DIAGNOSIS — I10 Essential (primary) hypertension: Secondary | ICD-10-CM | POA: Diagnosis not present

## 2021-03-23 DIAGNOSIS — E785 Hyperlipidemia, unspecified: Secondary | ICD-10-CM | POA: Diagnosis not present

## 2021-04-03 ENCOUNTER — Other Ambulatory Visit: Payer: Self-pay | Admitting: Family Medicine

## 2021-04-03 DIAGNOSIS — Z1231 Encounter for screening mammogram for malignant neoplasm of breast: Secondary | ICD-10-CM

## 2021-04-12 ENCOUNTER — Other Ambulatory Visit: Payer: Self-pay

## 2021-04-12 ENCOUNTER — Ambulatory Visit: Payer: Medicare Other | Admitting: Podiatry

## 2021-04-12 DIAGNOSIS — M79675 Pain in left toe(s): Secondary | ICD-10-CM

## 2021-04-12 DIAGNOSIS — Q828 Other specified congenital malformations of skin: Secondary | ICD-10-CM | POA: Diagnosis not present

## 2021-04-12 DIAGNOSIS — M79674 Pain in right toe(s): Secondary | ICD-10-CM | POA: Diagnosis not present

## 2021-04-12 DIAGNOSIS — B351 Tinea unguium: Secondary | ICD-10-CM | POA: Diagnosis not present

## 2021-04-17 NOTE — Progress Notes (Signed)
Subjective: 87 y.o. returns the office today for painful, elongated, thickened toenails which she cannot trim herself as well as for calluses to both of her feet.  She has recently purchased new shoes which have been helpful.  Her son also tries to keep moisturizer on the calluses.  No open lesions. Denies any skin breakdown or open sores or any pustules or blistering.  Objective: NAD DP/PT pulses palpable, CRT less than 3 seconds Nails hypertrophic, dystrophic, elongated, brittle, discolored 10. There is tenderness overlying the nails 1-5 bilaterally. There is no surrounding erythema or drainage along the nail sites. Hyperkeratotic lesion submetatarsal bilaterally x4 total.  No underlying ulceration drainage or any signs of infection. Prominent metatarsal heads plantarly.  Hammertoe contractures present. No open lesions or pre-ulcerative lesions are identified. No pain with calf compression, swelling, warmth, erythema.  Assessment: Patient presents with symptomatic onychomycosis; hyperkeratotic lesions  Plan: -Treatment options including alternatives, risks, complications were discussed -Nails sharply debrided 10 without complication/bleeding. -Hyperkeratotic lesions debrided x4 without any complications or bleeding.  Continue offloading. -Discussed daily foot inspection. If there are any changes, to call the office immediately.  -Follow-up in 3 months or sooner if any problems are to arise. In the meantime, encouraged to call the office with any questions, concerns, changes symptoms.  Ovid Curd, DPM

## 2021-05-04 DIAGNOSIS — M810 Age-related osteoporosis without current pathological fracture: Secondary | ICD-10-CM | POA: Diagnosis not present

## 2021-05-04 DIAGNOSIS — I129 Hypertensive chronic kidney disease with stage 1 through stage 4 chronic kidney disease, or unspecified chronic kidney disease: Secondary | ICD-10-CM | POA: Diagnosis not present

## 2021-05-04 DIAGNOSIS — I10 Essential (primary) hypertension: Secondary | ICD-10-CM | POA: Diagnosis not present

## 2021-05-04 DIAGNOSIS — E785 Hyperlipidemia, unspecified: Secondary | ICD-10-CM | POA: Diagnosis not present

## 2021-05-04 DIAGNOSIS — M170 Bilateral primary osteoarthritis of knee: Secondary | ICD-10-CM | POA: Diagnosis not present

## 2021-06-01 ENCOUNTER — Other Ambulatory Visit: Payer: Self-pay

## 2021-06-01 ENCOUNTER — Ambulatory Visit
Admission: RE | Admit: 2021-06-01 | Discharge: 2021-06-01 | Disposition: A | Discharge status: Home / Self Care | Payer: Medicare Other | Source: Ambulatory Visit | Attending: Family Medicine | Admitting: Family Medicine

## 2021-06-01 DIAGNOSIS — Z1231 Encounter for screening mammogram for malignant neoplasm of breast: Secondary | ICD-10-CM

## 2021-06-06 DIAGNOSIS — I10 Essential (primary) hypertension: Secondary | ICD-10-CM | POA: Diagnosis not present

## 2021-06-06 DIAGNOSIS — M170 Bilateral primary osteoarthritis of knee: Secondary | ICD-10-CM | POA: Diagnosis not present

## 2021-06-06 DIAGNOSIS — M810 Age-related osteoporosis without current pathological fracture: Secondary | ICD-10-CM | POA: Diagnosis not present

## 2021-06-26 DIAGNOSIS — F5101 Primary insomnia: Secondary | ICD-10-CM | POA: Diagnosis not present

## 2021-06-26 DIAGNOSIS — N183 Chronic kidney disease, stage 3 unspecified: Secondary | ICD-10-CM | POA: Diagnosis not present

## 2021-06-26 DIAGNOSIS — I10 Essential (primary) hypertension: Secondary | ICD-10-CM | POA: Diagnosis not present

## 2021-06-26 DIAGNOSIS — M810 Age-related osteoporosis without current pathological fracture: Secondary | ICD-10-CM | POA: Diagnosis not present

## 2021-06-26 DIAGNOSIS — H401194 Primary open-angle glaucoma, unspecified eye, indeterminate stage: Secondary | ICD-10-CM | POA: Diagnosis not present

## 2021-06-26 DIAGNOSIS — E785 Hyperlipidemia, unspecified: Secondary | ICD-10-CM | POA: Diagnosis not present

## 2021-06-26 DIAGNOSIS — R739 Hyperglycemia, unspecified: Secondary | ICD-10-CM | POA: Diagnosis not present

## 2021-07-19 ENCOUNTER — Other Ambulatory Visit: Payer: Self-pay

## 2021-07-19 ENCOUNTER — Ambulatory Visit: Payer: Medicare Other | Admitting: Podiatry

## 2021-07-19 DIAGNOSIS — M79675 Pain in left toe(s): Secondary | ICD-10-CM | POA: Diagnosis not present

## 2021-07-19 DIAGNOSIS — B351 Tinea unguium: Secondary | ICD-10-CM

## 2021-07-19 DIAGNOSIS — Q828 Other specified congenital malformations of skin: Secondary | ICD-10-CM | POA: Diagnosis not present

## 2021-07-19 DIAGNOSIS — M79674 Pain in right toe(s): Secondary | ICD-10-CM | POA: Diagnosis not present

## 2021-07-20 NOTE — Progress Notes (Signed)
Subjective: 87 y.o. returns the office today for painful, elongated, thickened toenails which she cannot trim herself as well as for calluses to both of her feet. Her son also tries to keep moisturizer on the calluses and tries to file it down gently to help it from not building up as much.  No open lesions. Denies any skin breakdown or open sores or any pustules or blistering.  Objective: NAD DP/PT pulses palpable, CRT less than 3 seconds Nails hypertrophic, dystrophic, elongated, brittle, discolored 10. There is tenderness overlying the nails 1-5 bilaterally. There is no surrounding erythema or drainage along the nail sites. Hyperkeratotic lesion submetatarsal bilaterally x4 total.  No underlying ulceration drainage or any signs of infection. Prominent metatarsal heads plantarly.  Hammertoe contractures present. No open lesions or pre-ulcerative lesions are identified. No pain with calf compression, swelling, warmth, erythema.  Assessment: Patient presents with symptomatic onychomycosis; hyperkeratotic lesions  Plan: -Treatment options including alternatives, risks, complications were discussed -Nails sharply debrided 10 without complication/bleeding. -Hyperkeratotic lesions debrided x4 without any complications or bleeding.  Continue offloading. -Discussed daily foot inspection. If there are any changes, to call the office immediately.  -Follow-up in 3 months or sooner if any problems are to arise. In the meantime, encouraged to call the office with any questions, concerns, changes symptoms.  Ovid Curd, DPM

## 2021-08-20 ENCOUNTER — Telehealth: Payer: Self-pay | Admitting: Podiatry

## 2021-08-20 NOTE — Telephone Encounter (Signed)
Patients son called to follow up on the topical cream that was suppose to be sent to his mother pharmacy

## 2021-08-21 ENCOUNTER — Other Ambulatory Visit: Payer: Self-pay | Admitting: Podiatry

## 2021-08-21 NOTE — Telephone Encounter (Signed)
I called and left a message for the patient's son and stated to call the [facility] office tomorrow. Misty Stanley

## 2021-08-21 NOTE — Progress Notes (Signed)
error 

## 2021-08-22 ENCOUNTER — Other Ambulatory Visit: Payer: Self-pay | Admitting: Podiatry

## 2021-08-22 ENCOUNTER — Telehealth: Payer: Self-pay | Admitting: *Deleted

## 2021-08-22 MED ORDER — NAFTIFINE HCL 1 % EX CREA: TOPICAL_CREAM | Freq: Every day | CUTANEOUS | 2 refills | Status: DC

## 2021-08-22 NOTE — Telephone Encounter (Signed)
Returned the call to patient's son,no answer,left vmessage that medication has been sent to pharmacy.

## 2021-08-22 NOTE — Telephone Encounter (Signed)
Patient's son is calling to request a refill of the Naftifine-1% cream. Please advise.

## 2021-09-07 DIAGNOSIS — M170 Bilateral primary osteoarthritis of knee: Secondary | ICD-10-CM | POA: Diagnosis not present

## 2021-09-07 DIAGNOSIS — N183 Chronic kidney disease, stage 3 unspecified: Secondary | ICD-10-CM | POA: Diagnosis not present

## 2021-09-07 DIAGNOSIS — I129 Hypertensive chronic kidney disease with stage 1 through stage 4 chronic kidney disease, or unspecified chronic kidney disease: Secondary | ICD-10-CM | POA: Diagnosis not present

## 2021-09-07 DIAGNOSIS — M810 Age-related osteoporosis without current pathological fracture: Secondary | ICD-10-CM | POA: Diagnosis not present

## 2021-09-07 DIAGNOSIS — E785 Hyperlipidemia, unspecified: Secondary | ICD-10-CM | POA: Diagnosis not present

## 2021-09-07 DIAGNOSIS — I10 Essential (primary) hypertension: Secondary | ICD-10-CM | POA: Diagnosis not present

## 2021-10-30 ENCOUNTER — Ambulatory Visit (INDEPENDENT_AMBULATORY_CARE_PROVIDER_SITE_OTHER): Payer: Medicare Other | Admitting: Podiatry

## 2021-10-30 ENCOUNTER — Other Ambulatory Visit: Payer: Self-pay

## 2021-10-30 DIAGNOSIS — M79674 Pain in right toe(s): Secondary | ICD-10-CM | POA: Diagnosis not present

## 2021-10-30 DIAGNOSIS — Q828 Other specified congenital malformations of skin: Secondary | ICD-10-CM | POA: Diagnosis not present

## 2021-10-30 DIAGNOSIS — M79675 Pain in left toe(s): Secondary | ICD-10-CM

## 2021-10-30 DIAGNOSIS — B351 Tinea unguium: Secondary | ICD-10-CM

## 2021-10-30 NOTE — Progress Notes (Signed)
Subjective: 87 y.o. returns the office today for painful, elongated, thickened toenails which she cannot trim herself as well as for calluses to both of her feet.her son states that occasionally will soak her foot and try to scrape the skin.  Denies any skin breakdown or any open sores.  No new concerns today.    Objective: NAD DP/PT pulses palpable, CRT less than 3 seconds Nails hypertrophic, dystrophic, elongated, brittle, discolored 10. There is tenderness overlying the nails 1-5 bilaterally. There is no surrounding erythema or drainage along the nail sites. Hyperkeratotic lesion submetatarsal bilaterally x4 total.  No underlying ulceration drainage or any signs of infection. Prominent metatarsal heads plantarly.  Hammertoe contractures present. No open lesions or pre-ulcerative lesions are identified. No pain with calf compression, swelling, warmth, erythema.  Assessment: Patient presents with symptomatic onychomycosis; hyperkeratotic lesions  Plan: -Treatment options including alternatives, risks, complications were discussed -Nails sharply debrided 10 without complication/bleeding. -Hyperkeratotic lesions debrided x4 without any complications or bleeding.  Continue offloading. -Discussed daily foot inspection. If there are any changes, to call the office immediately.  -Follow-up in 3 months or sooner if any problems are to arise. In the meantime, encouraged to call the office with any questions, concerns, changes symptoms.  Ovid Curd, DPM

## 2021-11-13 DIAGNOSIS — H401194 Primary open-angle glaucoma, unspecified eye, indeterminate stage: Secondary | ICD-10-CM | POA: Diagnosis not present

## 2021-11-13 DIAGNOSIS — M810 Age-related osteoporosis without current pathological fracture: Secondary | ICD-10-CM | POA: Diagnosis not present

## 2021-11-13 DIAGNOSIS — R739 Hyperglycemia, unspecified: Secondary | ICD-10-CM | POA: Diagnosis not present

## 2021-11-13 DIAGNOSIS — E785 Hyperlipidemia, unspecified: Secondary | ICD-10-CM | POA: Diagnosis not present

## 2021-11-13 DIAGNOSIS — Z23 Encounter for immunization: Secondary | ICD-10-CM | POA: Diagnosis not present

## 2021-11-13 DIAGNOSIS — I10 Essential (primary) hypertension: Secondary | ICD-10-CM | POA: Diagnosis not present

## 2021-11-13 DIAGNOSIS — N1832 Chronic kidney disease, stage 3b: Secondary | ICD-10-CM | POA: Diagnosis not present

## 2021-11-13 DIAGNOSIS — F5101 Primary insomnia: Secondary | ICD-10-CM | POA: Diagnosis not present

## 2021-11-19 DIAGNOSIS — N1832 Chronic kidney disease, stage 3b: Secondary | ICD-10-CM | POA: Diagnosis not present

## 2021-11-19 DIAGNOSIS — E785 Hyperlipidemia, unspecified: Secondary | ICD-10-CM | POA: Diagnosis not present

## 2021-11-19 DIAGNOSIS — I10 Essential (primary) hypertension: Secondary | ICD-10-CM | POA: Diagnosis not present

## 2021-11-19 DIAGNOSIS — M810 Age-related osteoporosis without current pathological fracture: Secondary | ICD-10-CM | POA: Diagnosis not present

## 2021-11-19 DIAGNOSIS — M170 Bilateral primary osteoarthritis of knee: Secondary | ICD-10-CM | POA: Diagnosis not present

## 2021-11-19 DIAGNOSIS — I129 Hypertensive chronic kidney disease with stage 1 through stage 4 chronic kidney disease, or unspecified chronic kidney disease: Secondary | ICD-10-CM | POA: Diagnosis not present

## 2022-02-05 ENCOUNTER — Other Ambulatory Visit: Payer: Self-pay

## 2022-02-05 ENCOUNTER — Ambulatory Visit (INDEPENDENT_AMBULATORY_CARE_PROVIDER_SITE_OTHER): Payer: Medicare Other | Admitting: Podiatry

## 2022-02-05 DIAGNOSIS — M79675 Pain in left toe(s): Secondary | ICD-10-CM

## 2022-02-05 DIAGNOSIS — B351 Tinea unguium: Secondary | ICD-10-CM | POA: Diagnosis not present

## 2022-02-05 DIAGNOSIS — M79674 Pain in right toe(s): Secondary | ICD-10-CM | POA: Diagnosis not present

## 2022-02-05 DIAGNOSIS — Q828 Other specified congenital malformations of skin: Secondary | ICD-10-CM

## 2022-02-08 NOTE — Progress Notes (Signed)
Subjective: ?88 y.o. returns the office today for painful, elongated, thickened toenails which she cannot trim herself as well as for calluses to both of her feet.  The patient's son tries to keep moisturizer on the calluses and he tries to lightly use a pumice stone.  Denies any skin breakdown or any open sores.  No new concerns today.   ? ?Objective: ?NAD ?DP/PT pulses palpable, CRT less than 3 seconds ?Nails hypertrophic, dystrophic, elongated, brittle, discolored ?10. There is tenderness overlying the nails 1-5 bilaterally. There is no surrounding erythema or drainage along the nail sites. ?Hyperkeratotic lesion submetatarsal bilaterally x4 total.  No underlying ulceration drainage or any signs of infection. ?Prominent metatarsal heads plantarly.  Hammertoe contractures present. ?No open lesions or pre-ulcerative lesions are identified. ?No pain with calf compression, swelling, warmth, erythema. ? ?Assessment: ?Patient presents with symptomatic onychomycosis; hyperkeratotic lesions ? ?Plan: ?-Treatment options including alternatives, risks, complications were discussed ?-Nails sharply debrided ?10 without complication/bleeding. ?-Hyperkeratotic lesions debrided x4 without any complications or bleeding.  Continue offloading. ?-Discussed daily foot inspection. If there are any changes, to call the office immediately.  ?-Follow-up in 3 months or sooner if any problems are to arise. In the meantime, encouraged to call the office with any questions, concerns, changes symptoms. ? ?Celesta Gentile, DPM ? ?

## 2022-02-18 DIAGNOSIS — E785 Hyperlipidemia, unspecified: Secondary | ICD-10-CM | POA: Diagnosis not present

## 2022-02-18 DIAGNOSIS — M170 Bilateral primary osteoarthritis of knee: Secondary | ICD-10-CM | POA: Diagnosis not present

## 2022-02-18 DIAGNOSIS — I10 Essential (primary) hypertension: Secondary | ICD-10-CM | POA: Diagnosis not present

## 2022-02-18 DIAGNOSIS — N1832 Chronic kidney disease, stage 3b: Secondary | ICD-10-CM | POA: Diagnosis not present

## 2022-02-18 DIAGNOSIS — M810 Age-related osteoporosis without current pathological fracture: Secondary | ICD-10-CM | POA: Diagnosis not present

## 2022-03-02 IMAGING — MG MM DIGITAL SCREENING BILAT W/ TOMO AND CAD
8 series · 9 of 24 positions shown · non-contrast
Comparison: Previous exam(s).

CLINICAL DATA: Screening.

EXAM:
DIGITAL SCREENING BILATERAL MAMMOGRAM WITH TOMOSYNTHESIS AND CAD
TECHNIQUE: Bilateral screening digital craniocaudal and mediolateral oblique
mammograms were obtained. Bilateral screening digital breast
tomosynthesis was performed. The images were evaluated with
computer-aided detection.

[L MLO synth-2D]
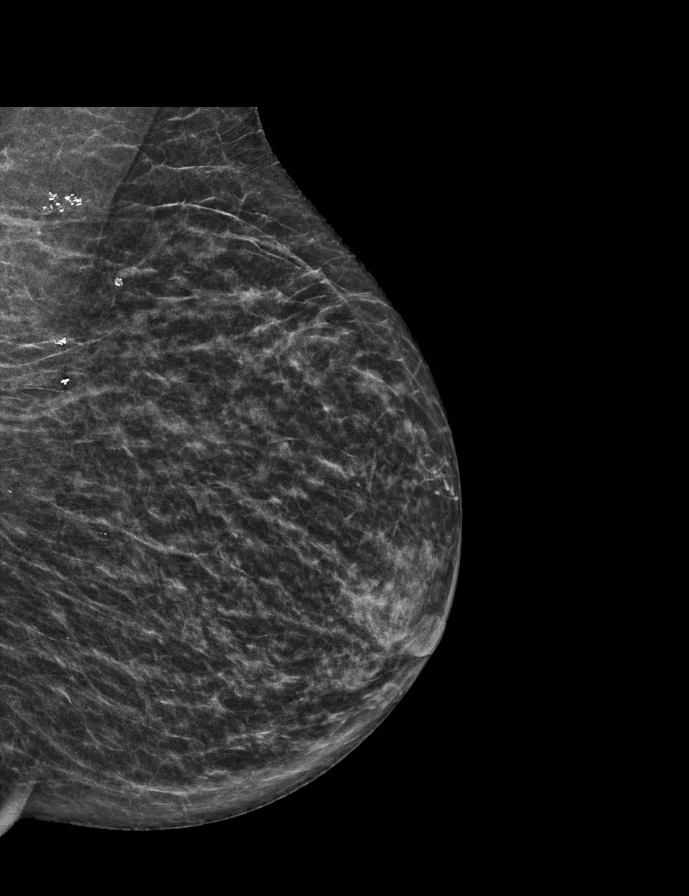

[R MLO synth-2D]
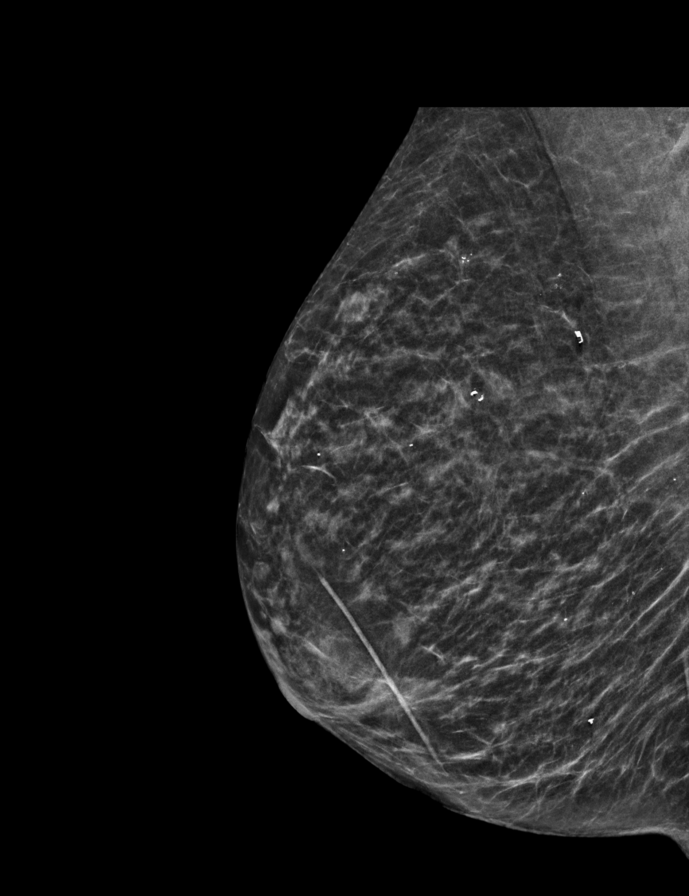

[L CC synth-2D]
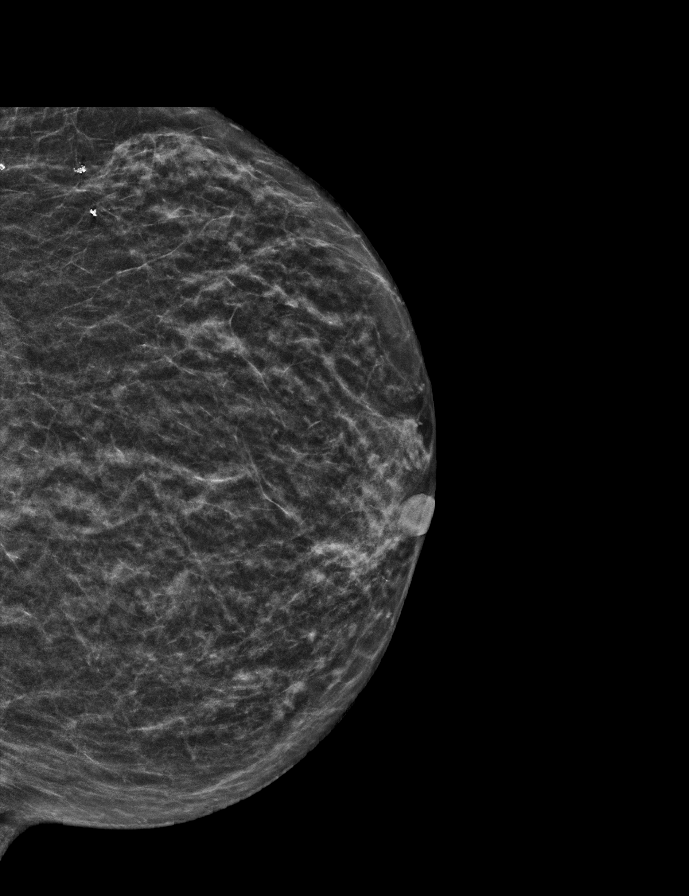

[R CC synth-2D]
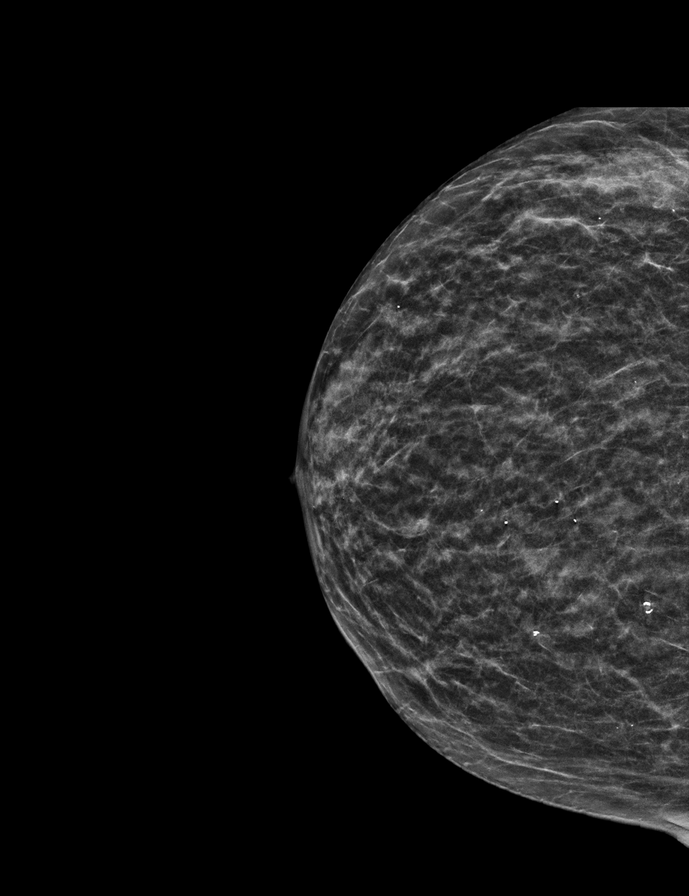

[R MLO tomo · 2 of 42 frames shown]
[frame 14/42]
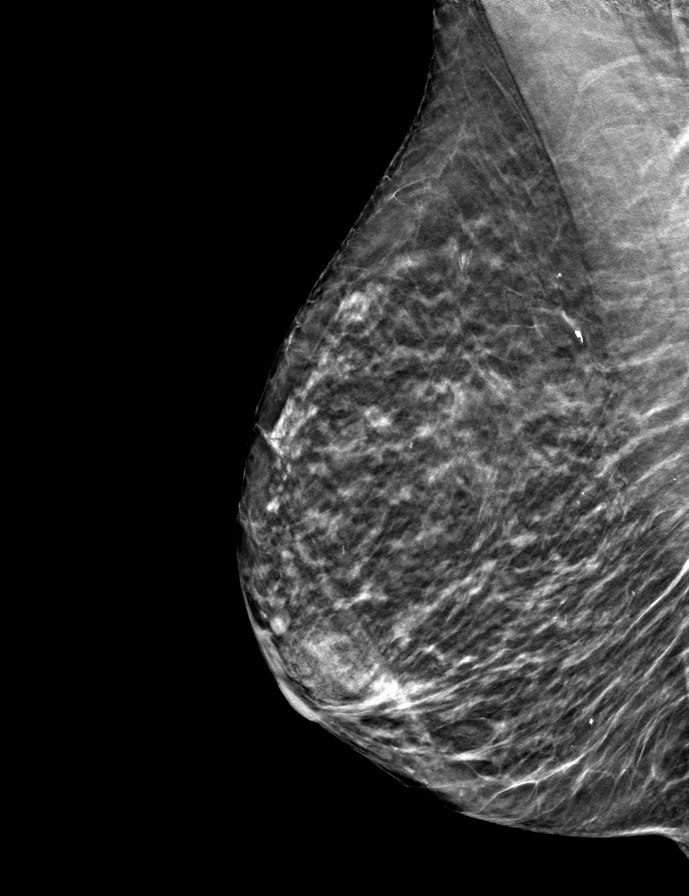
[frame 21/42]
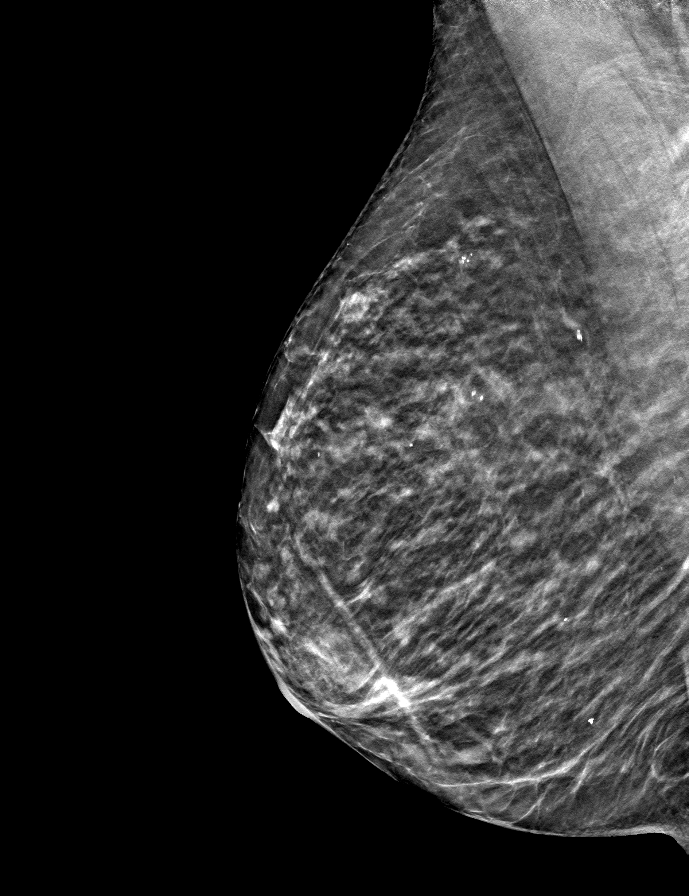

[L CC tomo · tomo slice 21/41.0]
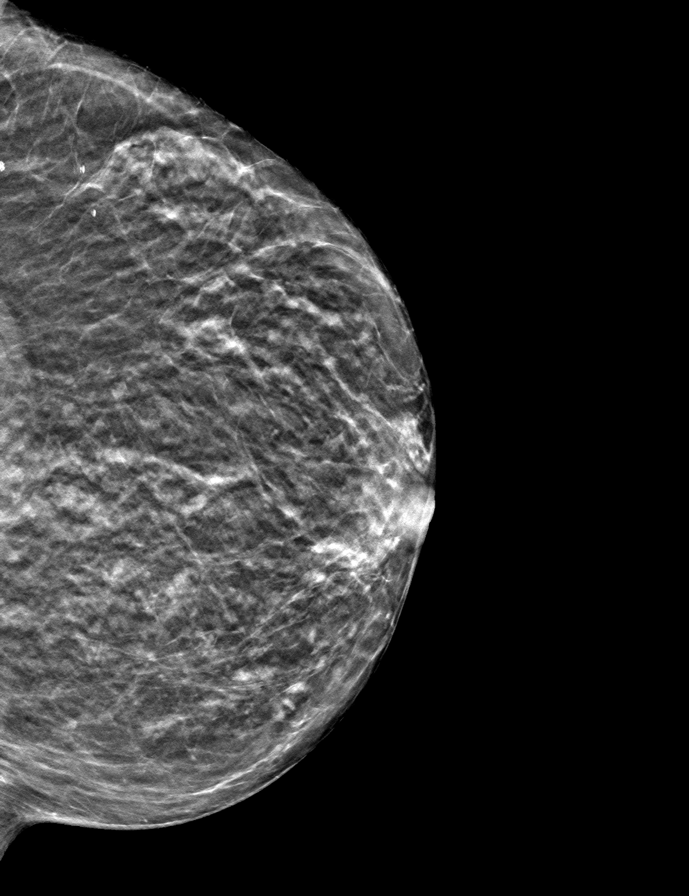

[L MLO tomo · tomo slice 23/44.0]
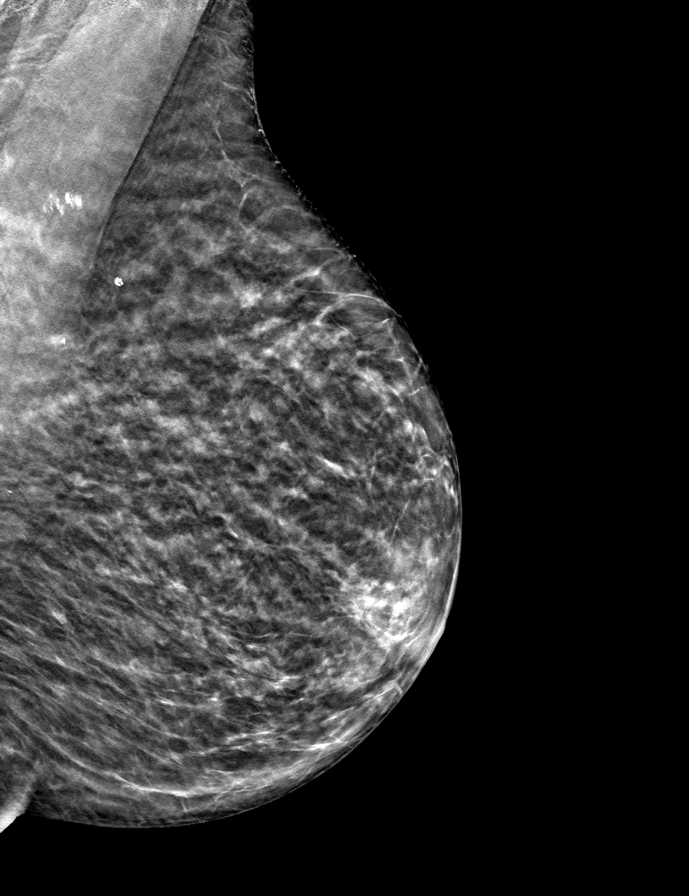

[R CC tomo · tomo slice 21/40.0]
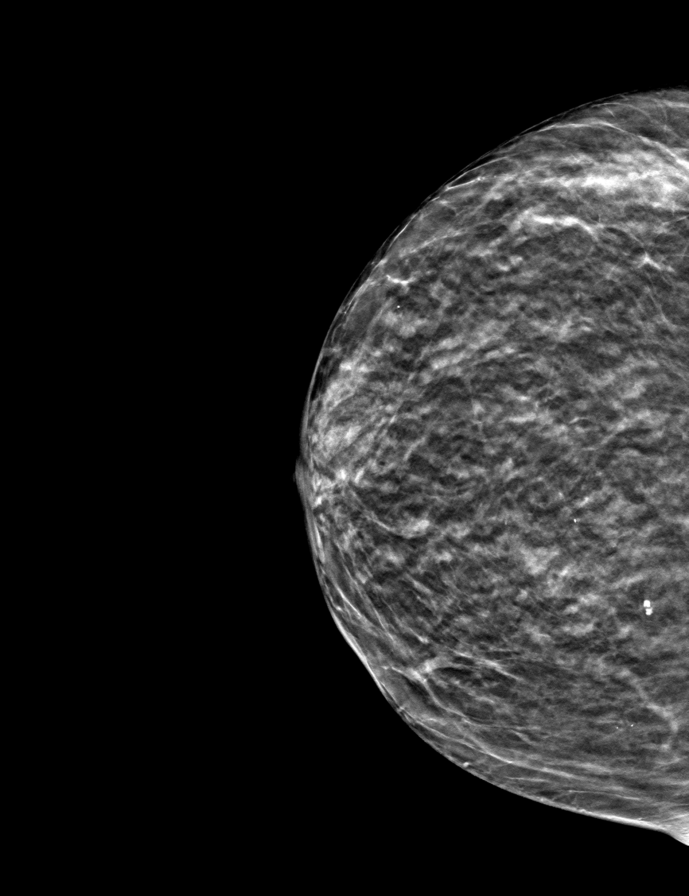

[9 of 24 positions shown; findings below may reference images not displayed]

ACR Breast Density Category c: The breast tissue is heterogeneously
dense, which may obscure small masses.
FINDINGS: There are no findings suspicious for malignancy.
IMPRESSION: No mammographic evidence of malignancy. A result letter of this
screening mammogram will be mailed directly to the patient.

RECOMMENDATION:
Screening mammogram in one year. (Code:Q3-W-BC3)

BI-RADS CATEGORY  1: Negative.

## 2022-03-26 DIAGNOSIS — F03B0 Unspecified dementia, moderate, without behavioral disturbance, psychotic disturbance, mood disturbance, and anxiety: Secondary | ICD-10-CM | POA: Diagnosis not present

## 2022-03-26 DIAGNOSIS — H401194 Primary open-angle glaucoma, unspecified eye, indeterminate stage: Secondary | ICD-10-CM | POA: Diagnosis not present

## 2022-03-26 DIAGNOSIS — N1832 Chronic kidney disease, stage 3b: Secondary | ICD-10-CM | POA: Diagnosis not present

## 2022-03-26 DIAGNOSIS — R739 Hyperglycemia, unspecified: Secondary | ICD-10-CM | POA: Diagnosis not present

## 2022-03-26 DIAGNOSIS — F5101 Primary insomnia: Secondary | ICD-10-CM | POA: Diagnosis not present

## 2022-03-26 DIAGNOSIS — R7989 Other specified abnormal findings of blood chemistry: Secondary | ICD-10-CM | POA: Diagnosis not present

## 2022-03-26 DIAGNOSIS — E785 Hyperlipidemia, unspecified: Secondary | ICD-10-CM | POA: Diagnosis not present

## 2022-03-26 DIAGNOSIS — R634 Abnormal weight loss: Secondary | ICD-10-CM | POA: Diagnosis not present

## 2022-03-26 DIAGNOSIS — I10 Essential (primary) hypertension: Secondary | ICD-10-CM | POA: Diagnosis not present

## 2022-03-26 DIAGNOSIS — H9193 Unspecified hearing loss, bilateral: Secondary | ICD-10-CM | POA: Diagnosis not present

## 2022-03-26 DIAGNOSIS — M810 Age-related osteoporosis without current pathological fracture: Secondary | ICD-10-CM | POA: Diagnosis not present

## 2022-04-23 DIAGNOSIS — Z03818 Encounter for observation for suspected exposure to other biological agents ruled out: Secondary | ICD-10-CM | POA: Diagnosis not present

## 2022-04-23 DIAGNOSIS — R051 Acute cough: Secondary | ICD-10-CM | POA: Diagnosis not present

## 2022-05-09 ENCOUNTER — Ambulatory Visit: Payer: Medicare Other | Admitting: Podiatry

## 2022-05-09 DIAGNOSIS — M79674 Pain in right toe(s): Secondary | ICD-10-CM | POA: Diagnosis not present

## 2022-05-09 DIAGNOSIS — M79675 Pain in left toe(s): Secondary | ICD-10-CM

## 2022-05-09 DIAGNOSIS — B351 Tinea unguium: Secondary | ICD-10-CM | POA: Diagnosis not present

## 2022-05-09 DIAGNOSIS — Q828 Other specified congenital malformations of skin: Secondary | ICD-10-CM | POA: Diagnosis not present

## 2022-05-09 MED ORDER — NAFTIFINE HCL 1 % EX CREA: TOPICAL_CREAM | Freq: Every day | CUTANEOUS | 2 refills | Status: AC

## 2022-05-11 NOTE — Progress Notes (Signed)
Subjective: 88 y.o. returns the office today for painful, elongated, thickened toenails which she cannot trim herself as well as for calluses to both of her feet.  The patient's son tries to keep moisturizer on the calluses and does file them at times.  No new concerns.  Objective: NAD DP/PT pulses palpable, CRT less than 3 seconds Nails hypertrophic, dystrophic, elongated, brittle, discolored 10. There is tenderness overlying the nails 1-5 bilaterally. There is no surrounding erythema or drainage along the nail sites. Hyperkeratotic lesion submetatarsal bilaterally x 4 total.  No underlying ulceration drainage or any signs of infection. Prominent metatarsal heads plantarly.  Hammertoe contractures present. No open lesions or pre-ulcerative lesions are identified. No pain with calf compression, swelling, warmth, erythema.  Assessment: Patient presents with symptomatic onychomycosis; hyperkeratotic lesions  Plan: -Treatment options including alternatives, risks, complications were discussed -Nails sharply debrided 10 without complication/bleeding. -Hyperkeratotic lesions debrided x4 without any complications or bleeding.  Continue offloading. -Discussed daily foot inspection. If there are any changes, to call the office immediately.  -Follow-up in 3 months or sooner if any problems are to arise. In the meantime, encouraged to call the office with any questions, concerns, changes symptoms.  Ovid Curd, DPM

## 2022-05-31 DIAGNOSIS — Z1231 Encounter for screening mammogram for malignant neoplasm of breast: Secondary | ICD-10-CM | POA: Diagnosis not present

## 2022-06-04 DIAGNOSIS — N1832 Chronic kidney disease, stage 3b: Secondary | ICD-10-CM | POA: Diagnosis not present

## 2022-06-04 DIAGNOSIS — E785 Hyperlipidemia, unspecified: Secondary | ICD-10-CM | POA: Diagnosis not present

## 2022-06-04 DIAGNOSIS — I10 Essential (primary) hypertension: Secondary | ICD-10-CM | POA: Diagnosis not present

## 2022-06-04 DIAGNOSIS — M810 Age-related osteoporosis without current pathological fracture: Secondary | ICD-10-CM | POA: Diagnosis not present

## 2022-07-01 ENCOUNTER — Emergency Department (HOSPITAL_COMMUNITY): Payer: Medicare Other

## 2022-07-01 ENCOUNTER — Other Ambulatory Visit: Payer: Self-pay

## 2022-07-01 ENCOUNTER — Emergency Department (HOSPITAL_COMMUNITY)
Admission: EM | Admit: 2022-07-01 | Discharge: 2022-07-02 | Disposition: A | Discharge status: Home / Self Care | Payer: Medicare Other | Attending: Emergency Medicine | Admitting: Emergency Medicine

## 2022-07-01 DIAGNOSIS — F0390 Unspecified dementia without behavioral disturbance: Secondary | ICD-10-CM | POA: Insufficient documentation

## 2022-07-01 DIAGNOSIS — R7309 Other abnormal glucose: Secondary | ICD-10-CM | POA: Diagnosis not present

## 2022-07-01 DIAGNOSIS — R4182 Altered mental status, unspecified: Secondary | ICD-10-CM | POA: Diagnosis not present

## 2022-07-01 DIAGNOSIS — I10 Essential (primary) hypertension: Secondary | ICD-10-CM | POA: Diagnosis not present

## 2022-07-01 DIAGNOSIS — Z9183 Wandering in diseases classified elsewhere: Secondary | ICD-10-CM

## 2022-07-01 LAB — CBC
HCT: 41.7 % (ref 36.0–46.0)
Hemoglobin: 13.9 g/dL (ref 12.0–15.0)
MCH: 32.7 pg (ref 26.0–34.0)
MCHC: 33.3 g/dL (ref 30.0–36.0)
MCV: 98.1 fL (ref 80.0–100.0)
Platelets: 186 10*3/uL (ref 150–400)
RBC: 4.25 MIL/uL (ref 3.87–5.11)
RDW: 11.7 % (ref 11.5–15.5)
WBC: 11.4 10*3/uL — ABNORMAL HIGH (ref 4.0–10.5)
nRBC: 0 % (ref 0.0–0.2)

## 2022-07-01 LAB — COMPREHENSIVE METABOLIC PANEL
ALT: 50 U/L — ABNORMAL HIGH (ref 0–44)
AST: 35 U/L (ref 15–41)
Albumin: 4.2 g/dL (ref 3.5–5.0)
Alkaline Phosphatase: 104 U/L (ref 38–126)
Anion gap: 9 (ref 5–15)
BUN: 27 mg/dL — ABNORMAL HIGH (ref 8–23)
CO2: 26 mmol/L (ref 22–32)
Calcium: 10 mg/dL (ref 8.9–10.3)
Chloride: 102 mmol/L (ref 98–111)
Creatinine, Ser: 1.31 mg/dL — ABNORMAL HIGH (ref 0.44–1.00)
GFR, Estimated: 38 mL/min — ABNORMAL LOW (ref >60)
Glucose, Bld: 127 mg/dL — ABNORMAL HIGH (ref 70–99)
Potassium: 3.9 mmol/L (ref 3.5–5.1)
Sodium: 137 mmol/L (ref 135–145)
Total Bilirubin: 0.8 mg/dL (ref 0.3–1.2)
Total Protein: 7 g/dL (ref 6.5–8.1)

## 2022-07-01 LAB — CBG MONITORING, ED: Glucose-Capillary: 119 mg/dL — ABNORMAL HIGH (ref 70–99)

## 2022-07-01 NOTE — ED Triage Notes (Signed)
  Bystanders called EMS because the patient was seen laying in grass. According to EMS, she can only answer yes/ no questions and tell her first name.    EMS vitals: 118/64 BP 70 HR 98 % SPO2 on room air 125 CBG

## 2022-07-01 NOTE — ED Provider Notes (Signed)
[facility] Provider Note   CSN: [id number] Arrival date & time: 07/01/22  2143     History  Chief Complaint  Patient presents with   Altered Mental Status    Emily Tran is a 88 y.o. female.  88 year old female with a history of dementia, hypertension presents to the emergency department via EMS.  Bystander called police after patient was found laying in the grass at an intersection.  Son is at bedside and states that the patient spends the day with him and sleeps alone in her house 1 block away from his.  He dropped her off there to put her to bed and then works a night shift.  He usually disconnects the power to the garage door so the patient does not leave the home, but forgot to do this today.  Arrived at her home to see the garage door up. From where the patient was found, he believes she was trying to walk to his home. Son reports the patient always complains of being cold. Ambulates without an assist device at baseline. Son reports patient to be A&O x 1 at baseline.  Level 5 caveat 2/2 dementia   Altered Mental Status      Home Medications Prior to Admission medications   Not on File      Allergies    Patient has no allergy information on record.    Review of Systems   Review of Systems Ten systems reviewed and are negative for acute change, except as noted in the HPI.    Physical Exam Updated Vital Signs BP 129/71   Pulse 73   Temp (!) 97.4 F (36.3 C) (Oral)   Resp 18   SpO2 99%   Physical Exam Vitals and nursing note reviewed.  Constitutional:      General: She is not in acute distress.    Appearance: She is well-developed. She is not diaphoretic.     Comments: Patient appears anxious, nontoxic.  HENT:     Head: Normocephalic and atraumatic.  Eyes:     General: No scleral icterus.    Conjunctiva/sclera: Conjunctivae normal.  Cardiovascular:     Rate and Rhythm: Normal rate and regular rhythm.     Pulses:  Normal pulses.  Pulmonary:     Effort: Pulmonary effort is normal. No respiratory distress.     Breath sounds: No stridor. No wheezing.     Comments: Lungs CTAB Abdominal:     Comments: Soft, nondistended, nontender.  Musculoskeletal:        General: Normal range of motion.     Cervical back: Normal range of motion.  Skin:    General: Skin is warm and dry.     Coloration: Skin is not pale.     Findings: No erythema or rash.     Comments: No bruising or signs of trauma to trunk or extremities.  Neurological:     Mental Status: She is alert.     Comments: A&O x 1. Does recognize son. Moving all extremities spontaneously.  Psychiatric:        Behavior: Behavior normal.     ED Results / Procedures / Treatments   Labs (all labs ordered are listed, but only abnormal results are displayed) Labs Reviewed  COMPREHENSIVE METABOLIC PANEL - Abnormal; Notable for the following components:      Result Value   Glucose, Bld 127 (*)    BUN 27 (*)    Creatinine, Ser 1.31 (*)  ALT 50 (*)    GFR, Estimated 38 (*)    All other components within normal limits  CBC - Abnormal; Notable for the following components:   WBC 11.4 (*)    All other components within normal limits  CBG MONITORING, ED - Abnormal; Notable for the following components:   Glucose-Capillary 119 (*)    All other components within normal limits  URINALYSIS, ROUTINE W REFLEX MICROSCOPIC    EKG EKG Interpretation  Date/Time:  Monday July 01 2022 22:34:12 EDT Ventricular Rate:  77 PR Interval:  170 QRS Duration: 158 QT Interval:  498 QTC Calculation: 560 R Axis:   127 Text Interpretation: Sinus rhythm Nonspecific intraventricular conduction delay ST depr, consider ischemia, inferior leads TECHNICALLY DIFFICULT background noise No old tracing to compare Confirmed by Melene Plan [phone]) on 07/02/2022 12:03:55 AM  Radiology No results found.  Procedures Procedures    Medications Ordered in ED Medications - No  data to display  ED Course/ Medical Decision Making/ A&P Clinical Course as of 07/02/22 0455  Tue Jul 02, 2022  0027 Son has declined completion of CXR, head CT, and UA. States patient is "just cold" and he is requesting her discharge. The patient has been evaluated at bedside by my attending, MD Adela Lank. [KH]    Clinical Course User Index [KH] Antony Madura, PA-C                           Medical Decision Making Amount and/or Complexity of Data Reviewed Labs: ordered. Radiology: ordered.   This patient presents to the ED for concern of AMS, this involves an extensive number of treatment options, and is a complaint that carries with it a high risk of complications and morbidity.  The differential diagnosis includes dementia vs acute encephalopathy vs CVA vs ICH vs hypoglycemia   Co morbidities that complicate the patient evaluation  HTN Dementia    Additional history obtained:  Additional history obtained from son and [employer]   Lab Tests:  I Ordered, and personally interpreted labs.  The pertinent results include:  Creatinine of 1.31 (hx of CKD), nonspecific leukocytosis of 11.4.   Cardiac Monitoring:  The patient was maintained on a cardiac monitor.  I personally viewed and interpreted the cardiac monitored which showed an underlying rhythm of: NSR   Medicines ordered and prescription drug management:  I have reviewed the patients home medicines and have made adjustments as needed   Test Considered:  UA CT head CXR   Critical Interventions:  Bear hugger for low temperature   Problem List / ED Course:  As above   Reevaluation:  After the interventions noted above, I reevaluated the patient and found that they have :stayed the same   Social Determinants of Health:  Insured patient   Dispostion:  After consideration of the diagnostic results and the patients response to treatment, I feel that the patent would benefit from close PCP follow up. Advised  strict return for any worsening symptoms or clinical decompensation. Son verbalizes understanding with no acute concerns.          Final Clinical Impression(s) / ED Diagnoses Final diagnoses:  Dementia in conditions classified elsewhere with wandering off [facility])    Rx / DC Orders ED Discharge Orders     None         Antony Madura, PA-C 07/02/22 0455    Melene Plan, DO 07/02/22 1216

## 2022-07-02 NOTE — ED Notes (Signed)
Pt removed IV in L AC. Bleeding controlled, wrapped with sterile gauze.

## 2022-07-02 NOTE — ED Notes (Signed)
Pt son advises that he preferred to get pt home and does not want urine sample performed. EDP notified.

## 2022-07-02 NOTE — Discharge Instructions (Addendum)
Your body temperature was a bit low in the ED. We recommend wearing additional layers at home. We advised evaluation of your urine as well as a chest xray and head CT which your declined today. Return for any new or concerning symptoms.

## 2022-07-03 DIAGNOSIS — N1832 Chronic kidney disease, stage 3b: Secondary | ICD-10-CM | POA: Diagnosis not present

## 2022-07-03 DIAGNOSIS — E785 Hyperlipidemia, unspecified: Secondary | ICD-10-CM | POA: Diagnosis not present

## 2022-07-03 DIAGNOSIS — M810 Age-related osteoporosis without current pathological fracture: Secondary | ICD-10-CM | POA: Diagnosis not present

## 2022-07-03 DIAGNOSIS — F03B0 Unspecified dementia, moderate, without behavioral disturbance, psychotic disturbance, mood disturbance, and anxiety: Secondary | ICD-10-CM | POA: Diagnosis not present

## 2022-07-03 DIAGNOSIS — I10 Essential (primary) hypertension: Secondary | ICD-10-CM | POA: Diagnosis not present

## 2022-07-03 DIAGNOSIS — J301 Allergic rhinitis due to pollen: Secondary | ICD-10-CM | POA: Diagnosis not present

## 2022-08-09 ENCOUNTER — Encounter: Payer: Self-pay | Admitting: Podiatry

## 2022-08-09 ENCOUNTER — Ambulatory Visit: Payer: Medicare Other | Admitting: Podiatry

## 2022-08-09 DIAGNOSIS — B351 Tinea unguium: Secondary | ICD-10-CM | POA: Diagnosis not present

## 2022-08-09 DIAGNOSIS — M79675 Pain in left toe(s): Secondary | ICD-10-CM

## 2022-08-09 DIAGNOSIS — Q828 Other specified congenital malformations of skin: Secondary | ICD-10-CM

## 2022-08-09 DIAGNOSIS — M79674 Pain in right toe(s): Secondary | ICD-10-CM

## 2022-08-09 NOTE — Progress Notes (Signed)
This patient presents to the office with chief complaint of long thick painful nails.  Patient says the nails are painful walking and wearing shoes.  This patient is unable to self treat.  This patient is unable to trim her nails since she is unable to reach her nails.  She also has callus left forefoot.  She presents to the office with her son.She presents to the office for preventative foot care services.  General Appearance  Alert, conversant and in no acute stress.  Vascular  Dorsalis pedis and posterior tibial  pulses are  weakly palpable  bilaterally.  Capillary return is within normal limits  bilaterally. Temperature is within normal limits  bilaterally.  Neurologic  Senn-Weinstein monofilament wire test within normal limits  bilaterally. Muscle power within normal limits bilaterally.  Nails Thick disfigured discolored nails with subungual debris  from hallux to fifth toes bilaterally. No evidence of bacterial infection or drainage bilaterally.  Orthopedic  No limitations of motion  feet .  No crepitus or effusions noted.  No bony pathology or digital deformities noted. Overlapping second toe left foot.Plantar flexed second met left foot.  Skin  normotropic skin with no porokeratosis noted bilaterally.  No signs of infections or ulcers noted.  Porokeratosis sub 2 left foot.   Onychomycosis  Nails  B/L.  Pain in right toes  Pain in left toes  Callus left foot.  Debridement of nails both feet followed trimming the nails with dremel tool.  Debridement of callus with # 15 blade and dremel tool usage.  RTC 3 months.   Gardiner Barefoot DPM

## 2022-08-16 DIAGNOSIS — N1832 Chronic kidney disease, stage 3b: Secondary | ICD-10-CM | POA: Diagnosis not present

## 2022-08-16 DIAGNOSIS — E785 Hyperlipidemia, unspecified: Secondary | ICD-10-CM | POA: Diagnosis not present

## 2022-08-16 DIAGNOSIS — M810 Age-related osteoporosis without current pathological fracture: Secondary | ICD-10-CM | POA: Diagnosis not present

## 2022-08-16 DIAGNOSIS — I10 Essential (primary) hypertension: Secondary | ICD-10-CM | POA: Diagnosis not present

## 2022-10-14 DIAGNOSIS — Z79899 Other long term (current) drug therapy: Secondary | ICD-10-CM | POA: Diagnosis not present

## 2022-10-14 DIAGNOSIS — J3089 Other allergic rhinitis: Secondary | ICD-10-CM | POA: Diagnosis not present

## 2022-10-14 DIAGNOSIS — I10 Essential (primary) hypertension: Secondary | ICD-10-CM | POA: Diagnosis not present

## 2022-10-14 DIAGNOSIS — E785 Hyperlipidemia, unspecified: Secondary | ICD-10-CM | POA: Diagnosis not present

## 2022-10-14 DIAGNOSIS — F5101 Primary insomnia: Secondary | ICD-10-CM | POA: Diagnosis not present

## 2022-10-14 DIAGNOSIS — Z6822 Body mass index (BMI) 22.0-22.9, adult: Secondary | ICD-10-CM | POA: Diagnosis not present

## 2022-10-14 DIAGNOSIS — H401194 Primary open-angle glaucoma, unspecified eye, indeterminate stage: Secondary | ICD-10-CM | POA: Diagnosis not present

## 2022-10-14 DIAGNOSIS — N1832 Chronic kidney disease, stage 3b: Secondary | ICD-10-CM | POA: Diagnosis not present

## 2022-10-14 DIAGNOSIS — M810 Age-related osteoporosis without current pathological fracture: Secondary | ICD-10-CM | POA: Diagnosis not present

## 2022-11-08 ENCOUNTER — Ambulatory Visit: Payer: Medicare Other | Admitting: Podiatry

## 2022-11-19 DIAGNOSIS — N1832 Chronic kidney disease, stage 3b: Secondary | ICD-10-CM | POA: Diagnosis not present

## 2022-11-19 DIAGNOSIS — R410 Disorientation, unspecified: Secondary | ICD-10-CM | POA: Diagnosis not present

## 2022-11-19 DIAGNOSIS — Z6823 Body mass index (BMI) 23.0-23.9, adult: Secondary | ICD-10-CM | POA: Diagnosis not present

## 2022-11-19 DIAGNOSIS — F03B0 Unspecified dementia, moderate, without behavioral disturbance, psychotic disturbance, mood disturbance, and anxiety: Secondary | ICD-10-CM | POA: Diagnosis not present

## 2022-11-19 DIAGNOSIS — N390 Urinary tract infection, site not specified: Secondary | ICD-10-CM | POA: Diagnosis not present

## 2022-12-11 DIAGNOSIS — N1832 Chronic kidney disease, stage 3b: Secondary | ICD-10-CM | POA: Diagnosis not present

## 2022-12-11 DIAGNOSIS — E785 Hyperlipidemia, unspecified: Secondary | ICD-10-CM | POA: Diagnosis not present

## 2022-12-11 DIAGNOSIS — I10 Essential (primary) hypertension: Secondary | ICD-10-CM | POA: Diagnosis not present

## 2022-12-11 DIAGNOSIS — M810 Age-related osteoporosis without current pathological fracture: Secondary | ICD-10-CM | POA: Diagnosis not present

## 2023-01-15 ENCOUNTER — Encounter: Payer: Self-pay | Admitting: Podiatry

## 2023-01-15 ENCOUNTER — Ambulatory Visit: Payer: Medicare Other | Admitting: Podiatry

## 2023-01-15 DIAGNOSIS — M79675 Pain in left toe(s): Secondary | ICD-10-CM

## 2023-01-15 DIAGNOSIS — M79674 Pain in right toe(s): Secondary | ICD-10-CM | POA: Diagnosis not present

## 2023-01-15 DIAGNOSIS — B351 Tinea unguium: Secondary | ICD-10-CM

## 2023-01-15 NOTE — Progress Notes (Signed)
This patient presents to the office with chief complaint of long thick painful nails.  Patient says the nails are painful walking and wearing shoes.  This patient is unable to self treat.  This patient is unable to trim her nails since she is unable to reach her nails.  She also has callus left forefoot.  She presents to the office with her son.She presents to the office for preventative foot care services.  General Appearance  Alert, conversant and in no acute stress.  Vascular  Dorsalis pedis and posterior tibial  pulses are  weakly palpable  bilaterally.  Capillary return is within normal limits  bilaterally. Temperature is within normal limits  bilaterally.  Neurologic  Senn-Weinstein monofilament wire test within normal limits  bilaterally. Muscle power within normal limits bilaterally.  Nails Thick disfigured discolored nails with subungual debris  from hallux to fifth toes bilaterally. No evidence of bacterial infection or drainage bilaterally.  Orthopedic  No limitations of motion  feet .  No crepitus or effusions noted.  No bony pathology or digital deformities noted. Overlapping second toe left foot.Plantar flexed second met left foot.  Skin  normotropic skin with no porokeratosis noted bilaterally.  No signs of infections or ulcers noted.    Onychomycosis  Nails  B/L.  Pain in right toes  Pain in left toes   Debridement of nails both feet followed trimming the nails with dremel tool.  Debridement of callus with # 15 blade and dremel tool usage.  RTC 3 months.   Gardiner Barefoot DPM

## 2023-01-27 DIAGNOSIS — M810 Age-related osteoporosis without current pathological fracture: Secondary | ICD-10-CM | POA: Diagnosis not present

## 2023-01-27 DIAGNOSIS — I129 Hypertensive chronic kidney disease with stage 1 through stage 4 chronic kidney disease, or unspecified chronic kidney disease: Secondary | ICD-10-CM | POA: Diagnosis not present

## 2023-01-27 DIAGNOSIS — E785 Hyperlipidemia, unspecified: Secondary | ICD-10-CM | POA: Diagnosis not present

## 2023-01-27 DIAGNOSIS — N1832 Chronic kidney disease, stage 3b: Secondary | ICD-10-CM | POA: Diagnosis not present

## 2023-01-27 DIAGNOSIS — I10 Essential (primary) hypertension: Secondary | ICD-10-CM | POA: Diagnosis not present

## 2023-02-17 ENCOUNTER — Emergency Department (HOSPITAL_COMMUNITY)
Admission: EM | Admit: 2023-02-17 | Discharge: 2023-02-17 | Disposition: A | Discharge status: Home / Self Care | Payer: Medicare Other | Attending: Emergency Medicine | Admitting: Emergency Medicine

## 2023-02-17 ENCOUNTER — Emergency Department (HOSPITAL_COMMUNITY): Payer: Medicare Other

## 2023-02-17 DIAGNOSIS — M810 Age-related osteoporosis without current pathological fracture: Secondary | ICD-10-CM | POA: Diagnosis not present

## 2023-02-17 DIAGNOSIS — R262 Difficulty in walking, not elsewhere classified: Secondary | ICD-10-CM

## 2023-02-17 DIAGNOSIS — R059 Cough, unspecified: Secondary | ICD-10-CM | POA: Diagnosis not present

## 2023-02-17 DIAGNOSIS — Z6823 Body mass index (BMI) 23.0-23.9, adult: Secondary | ICD-10-CM | POA: Diagnosis not present

## 2023-02-17 DIAGNOSIS — R4182 Altered mental status, unspecified: Secondary | ICD-10-CM | POA: Diagnosis present

## 2023-02-17 DIAGNOSIS — E782 Mixed hyperlipidemia: Secondary | ICD-10-CM | POA: Diagnosis not present

## 2023-02-17 DIAGNOSIS — I10 Essential (primary) hypertension: Secondary | ICD-10-CM | POA: Diagnosis not present

## 2023-02-17 DIAGNOSIS — Z1152 Encounter for screening for COVID-19: Secondary | ICD-10-CM | POA: Insufficient documentation

## 2023-02-17 DIAGNOSIS — Z743 Need for continuous supervision: Secondary | ICD-10-CM | POA: Diagnosis not present

## 2023-02-17 DIAGNOSIS — I129 Hypertensive chronic kidney disease with stage 1 through stage 4 chronic kidney disease, or unspecified chronic kidney disease: Secondary | ICD-10-CM | POA: Insufficient documentation

## 2023-02-17 DIAGNOSIS — Z79899 Other long term (current) drug therapy: Secondary | ICD-10-CM | POA: Diagnosis not present

## 2023-02-17 DIAGNOSIS — F5101 Primary insomnia: Secondary | ICD-10-CM | POA: Diagnosis not present

## 2023-02-17 DIAGNOSIS — N183 Chronic kidney disease, stage 3 unspecified: Secondary | ICD-10-CM | POA: Diagnosis not present

## 2023-02-17 DIAGNOSIS — R404 Transient alteration of awareness: Secondary | ICD-10-CM | POA: Diagnosis not present

## 2023-02-17 DIAGNOSIS — F0390 Unspecified dementia without behavioral disturbance: Secondary | ICD-10-CM | POA: Diagnosis not present

## 2023-02-17 DIAGNOSIS — N189 Chronic kidney disease, unspecified: Secondary | ICD-10-CM | POA: Diagnosis not present

## 2023-02-17 DIAGNOSIS — F03B0 Unspecified dementia, moderate, without behavioral disturbance, psychotic disturbance, mood disturbance, and anxiety: Secondary | ICD-10-CM | POA: Diagnosis not present

## 2023-02-17 LAB — RESP PANEL BY RT-PCR (RSV, FLU A&B, COVID)  RVPGX2
Resp Syncytial Virus by PCR: NEGATIVE
SARS Coronavirus 2 by RT PCR: NEGATIVE

## 2023-03-18 DIAGNOSIS — I129 Hypertensive chronic kidney disease with stage 1 through stage 4 chronic kidney disease, or unspecified chronic kidney disease: Secondary | ICD-10-CM | POA: Diagnosis not present

## 2023-03-18 DIAGNOSIS — M810 Age-related osteoporosis without current pathological fracture: Secondary | ICD-10-CM | POA: Diagnosis not present

## 2023-03-18 DIAGNOSIS — E785 Hyperlipidemia, unspecified: Secondary | ICD-10-CM | POA: Diagnosis not present

## 2023-03-18 DIAGNOSIS — N1832 Chronic kidney disease, stage 3b: Secondary | ICD-10-CM | POA: Diagnosis not present

## 2023-03-18 DIAGNOSIS — I10 Essential (primary) hypertension: Secondary | ICD-10-CM | POA: Diagnosis not present

## 2023-04-18 ENCOUNTER — Ambulatory Visit: Payer: Medicare Other | Admitting: Podiatry

## 2023-04-21 ENCOUNTER — Encounter: Payer: Self-pay | Admitting: Podiatry

## 2023-04-21 ENCOUNTER — Ambulatory Visit: Payer: Medicare Other | Admitting: Podiatry

## 2023-04-21 DIAGNOSIS — B351 Tinea unguium: Secondary | ICD-10-CM

## 2023-04-21 DIAGNOSIS — M79675 Pain in left toe(s): Secondary | ICD-10-CM

## 2023-04-21 DIAGNOSIS — M79674 Pain in right toe(s): Secondary | ICD-10-CM | POA: Diagnosis not present

## 2023-04-21 DIAGNOSIS — Q828 Other specified congenital malformations of skin: Secondary | ICD-10-CM

## 2023-04-21 NOTE — Progress Notes (Signed)
  Subjective:  Patient ID: Emily Tran, female    DOB: Apr 27, 1927,   MRN: [id number]  Chief Complaint  Patient presents with   Nail Problem     Routine foot care    89 y.o. female presents for concern of thickened elongated and painful nails that are difficult to trim. Requesting to have them trimmed today.  PCP:  Sigmund Hazel, MD    . Denies any other pedal complaints. Denies n/v/f/c.   History reviewed. No pertinent past medical history.  Objective:  Physical Exam: Vascular: DP/PT pulses 2/4 bilateral. CFT <3 seconds. Normal hair growth on digits. No edema.  Skin. No lacerations or abrasions bilateral feet. Nails 1-5 bilateral are thickened elongated and discolored with subungual debris. Hyperkeratotic cored lesio noted sub third metatarsal bilateral.  Musculoskeletal: MMT 5/5 bilateral lower extremities in DF, PF, Inversion and Eversion. Deceased ROM in DF of ankle joint.  Neurological: Sensation intact to light touch.   Assessment:   1. Pain due to onychomycosis of toenails of both feet   2. Porokeratosis      Plan:  Patient was evaluated and treated and all questions answered. -Mechanically debrided all nails 1-5 bilateral using sterile nail nipper and filed with dremel without incident  -Debrided hyperkeratotic tissue as courtesy today.   -Answered all patient questions -Patient to return  in 3 months for at risk foot care -Patient advised to call the office if any problems or questions arise in the meantime.   Louann Sjogren, DPM

## 2023-04-24 NOTE — Progress Notes (Signed)
Infection is a common cause of altered mental status and delirium in elderly adults.  With COVID and flu being common infections that cause this.

## 2023-07-30 ENCOUNTER — Encounter: Payer: Self-pay | Admitting: Podiatry

## 2023-07-30 ENCOUNTER — Ambulatory Visit: Payer: Medicare Other | Admitting: Podiatry

## 2023-07-30 DIAGNOSIS — B351 Tinea unguium: Secondary | ICD-10-CM

## 2023-07-30 DIAGNOSIS — M79675 Pain in left toe(s): Secondary | ICD-10-CM | POA: Diagnosis not present

## 2023-07-30 DIAGNOSIS — M79674 Pain in right toe(s): Secondary | ICD-10-CM | POA: Diagnosis not present

## 2023-07-30 NOTE — Progress Notes (Signed)
Subjective:  Patient ID: Emily Tran, female    DOB: 06/17/1927,   MRN: [id number]  No chief complaint on file.   89 y.o. female presents for concern of thickened elongated and painful nails that are difficult to trim. Requesting to have them trimmed today.  PCP:  Sigmund Hazel, MD    . Denies any other pedal complaints. Denies n/v/f/c.   No past medical history on file.  Objective:  Physical Exam: Vascular: DP/PT pulses 2/4 bilateral. CFT <3 seconds. Normal hair growth on digits. No edema.  Skin. No lacerations or abrasions bilateral feet. Nails 1-5 bilateral are thickened elongated and discolored with subungual debris. Hyperkeratotic cored lesio noted sub third metatarsal bilateral.  Musculoskeletal: MMT 5/5 bilateral lower extremities in DF, PF, Inversion and Eversion. Deceased ROM in DF of ankle joint.  Neurological: Sensation intact to light touch.   Assessment:   1. Pain due to onychomycosis of toenails of both feet      Plan:  Patient was evaluated and treated and all questions answered. -Mechanically debrided all nails 1-5 bilateral using sterile nail nipper and filed with dremel without incident  -Debrided hyperkeratotic tissue as courtesy today.  -Answered all patient questions -Patient to return  in 3 months for at risk foot care -Patient advised to call the office if any problems or questions arise in the meantime.   Louann Sjogren, DPM

## 2023-08-27 DIAGNOSIS — I10 Essential (primary) hypertension: Secondary | ICD-10-CM | POA: Diagnosis not present

## 2023-08-27 DIAGNOSIS — R739 Hyperglycemia, unspecified: Secondary | ICD-10-CM | POA: Diagnosis not present

## 2023-08-27 DIAGNOSIS — Z23 Encounter for immunization: Secondary | ICD-10-CM | POA: Diagnosis not present

## 2023-08-27 DIAGNOSIS — N1832 Chronic kidney disease, stage 3b: Secondary | ICD-10-CM | POA: Diagnosis not present

## 2023-08-27 DIAGNOSIS — E782 Mixed hyperlipidemia: Secondary | ICD-10-CM | POA: Diagnosis not present

## 2023-10-06 DIAGNOSIS — J069 Acute upper respiratory infection, unspecified: Secondary | ICD-10-CM | POA: Diagnosis not present

## 2023-10-06 DIAGNOSIS — Z03818 Encounter for observation for suspected exposure to other biological agents ruled out: Secondary | ICD-10-CM | POA: Diagnosis not present

## 2023-10-29 ENCOUNTER — Ambulatory Visit: Payer: Medicare Other | Admitting: Podiatry

## 2023-10-29 ENCOUNTER — Encounter: Payer: Self-pay | Admitting: Podiatry

## 2023-10-29 DIAGNOSIS — B351 Tinea unguium: Secondary | ICD-10-CM

## 2023-10-29 DIAGNOSIS — M79674 Pain in right toe(s): Secondary | ICD-10-CM

## 2023-10-29 DIAGNOSIS — M79675 Pain in left toe(s): Secondary | ICD-10-CM | POA: Diagnosis not present

## 2023-10-29 NOTE — Progress Notes (Signed)
   Subjective:  Patient ID: Emily Tran, female    DOB: 06/17/1927,   MRN: [id number]  No chief complaint on file.   89 y.o. female presents for concern of thickened elongated and painful nails that are difficult to trim. Requesting to have them trimmed today.  PCP:  Sigmund Hazel, MD    . Denies any other pedal complaints. Denies n/v/f/c.   No past medical history on file.  Objective:  Physical Exam: Vascular: DP/PT pulses 2/4 bilateral. CFT <3 seconds. Normal hair growth on digits. No edema.  Skin. No lacerations or abrasions bilateral feet. Nails 1-5 bilateral are thickened elongated and discolored with subungual debris. Hyperkeratotic cored lesio noted sub third metatarsal bilateral.  Musculoskeletal: MMT 5/5 bilateral lower extremities in DF, PF, Inversion and Eversion. Deceased ROM in DF of ankle joint.  Neurological: Sensation intact to light touch.   Assessment:   1. Pain due to onychomycosis of toenails of both feet      Plan:  Patient was evaluated and treated and all questions answered. -Mechanically debrided all nails 1-5 bilateral using sterile nail nipper and filed with dremel without incident  -Debrided hyperkeratotic tissue as courtesy today.  -Answered all patient questions -Patient to return  in 3 months for at risk foot care -Patient advised to call the office if any problems or questions arise in the meantime.   Louann Sjogren, DPM

## 2024-01-28 ENCOUNTER — Ambulatory Visit: Payer: Medicare Other | Admitting: Podiatry

## 2024-01-28 ENCOUNTER — Encounter: Payer: Self-pay | Admitting: Podiatry

## 2024-01-28 DIAGNOSIS — M79675 Pain in left toe(s): Secondary | ICD-10-CM | POA: Diagnosis not present

## 2024-01-28 DIAGNOSIS — B351 Tinea unguium: Secondary | ICD-10-CM

## 2024-01-28 DIAGNOSIS — M79674 Pain in right toe(s): Secondary | ICD-10-CM | POA: Diagnosis not present

## 2024-01-28 NOTE — Progress Notes (Signed)
   Subjective:  Patient ID: Emily Tran, female    DOB: 06/17/1927,   MRN: [id number]  No chief complaint on file.   89 y.o. female presents for concern of thickened elongated and painful nails that are difficult to trim. Requesting to have them trimmed today.  PCP:  Sigmund Hazel, MD    . Denies any other pedal complaints. Denies n/v/f/c.   No past medical history on file.  Objective:  Physical Exam: Vascular: DP/PT pulses 2/4 bilateral. CFT <3 seconds. Normal hair growth on digits. No edema.  Skin. No lacerations or abrasions bilateral feet. Nails 1-5 bilateral are thickened elongated and discolored with subungual debris. Hyperkeratotic cored lesio noted sub third metatarsal bilateral.  Musculoskeletal: MMT 5/5 bilateral lower extremities in DF, PF, Inversion and Eversion. Deceased ROM in DF of ankle joint.  Neurological: Sensation intact to light touch.   Assessment:   1. Pain due to onychomycosis of toenails of both feet      Plan:  Patient was evaluated and treated and all questions answered. -Mechanically debrided all nails 1-5 bilateral using sterile nail nipper and filed with dremel without incident  -Debrided hyperkeratotic tissue as courtesy today.  -Answered all patient questions -Patient to return  in 3 months for at risk foot care -Patient advised to call the office if any problems or questions arise in the meantime.   Louann Sjogren, DPM

## 2024-02-13 DIAGNOSIS — I10 Essential (primary) hypertension: Secondary | ICD-10-CM | POA: Diagnosis not present

## 2024-02-19 DIAGNOSIS — E782 Mixed hyperlipidemia: Secondary | ICD-10-CM | POA: Diagnosis not present

## 2024-02-19 DIAGNOSIS — N1832 Chronic kidney disease, stage 3b: Secondary | ICD-10-CM | POA: Diagnosis not present

## 2024-02-19 DIAGNOSIS — I10 Essential (primary) hypertension: Secondary | ICD-10-CM | POA: Diagnosis not present

## 2024-02-19 DIAGNOSIS — J301 Allergic rhinitis due to pollen: Secondary | ICD-10-CM | POA: Diagnosis not present

## 2024-02-19 DIAGNOSIS — M810 Age-related osteoporosis without current pathological fracture: Secondary | ICD-10-CM | POA: Diagnosis not present

## 2024-02-19 DIAGNOSIS — Z0000 Encounter for general adult medical examination without abnormal findings: Secondary | ICD-10-CM | POA: Diagnosis not present

## 2024-03-16 DIAGNOSIS — M8588 Other specified disorders of bone density and structure, other site: Secondary | ICD-10-CM | POA: Diagnosis not present

## 2024-03-16 DIAGNOSIS — M810 Age-related osteoporosis without current pathological fracture: Secondary | ICD-10-CM | POA: Diagnosis not present

## 2024-04-06 DIAGNOSIS — M810 Age-related osteoporosis without current pathological fracture: Secondary | ICD-10-CM | POA: Diagnosis not present

## 2024-05-05 ENCOUNTER — Encounter: Payer: Self-pay | Admitting: Podiatry

## 2024-05-05 ENCOUNTER — Ambulatory Visit: Admitting: Podiatry

## 2024-05-05 DIAGNOSIS — B351 Tinea unguium: Secondary | ICD-10-CM | POA: Diagnosis not present

## 2024-05-05 DIAGNOSIS — M79675 Pain in left toe(s): Secondary | ICD-10-CM

## 2024-05-05 DIAGNOSIS — M79674 Pain in right toe(s): Secondary | ICD-10-CM

## 2024-05-05 NOTE — Progress Notes (Signed)
  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 Cleotilde last visit April 2025    89 y.o. female presents for concern of thickened elongated and painful nails that are difficult to trim. Requesting to have them trimmed today.  PCP:  Cleotilde Planas, MD    . Denies any other pedal complaints. Denies n/v/f/c.   No past medical history on file.  Objective:  Physical Exam: Vascular: DP/PT pulses 2/4 bilateral. CFT <3 seconds. Normal hair growth on digits. No edema.  Skin. No lacerations or abrasions bilateral feet. Nails 1-5 bilateral are thickened elongated and discolored with subungual debris. Hyperkeratotic cored lesio noted sub third metatarsal bilateral.  Musculoskeletal: MMT 5/5 bilateral lower extremities in DF, PF, Inversion and Eversion. Deceased ROM in DF of ankle joint.  Neurological: Sensation intact to light touch.   Assessment:   1. Pain due to onychomycosis of toenails of both feet        Plan:  Patient was evaluated and treated and all questions answered. -Mechanically debrided all nails 1-5 bilateral using sterile nail nipper and filed with dremel without incident  -Debrided hyperkeratotic tissue as courtesy today.  -Answered all patient questions -Patient to return  in 3 months for at risk foot care -Patient advised to call the office if any problems or questions arise in the meantime.   Asberry Failing, DPM

## 2024-05-17 DIAGNOSIS — M810 Age-related osteoporosis without current pathological fracture: Secondary | ICD-10-CM | POA: Diagnosis not present

## 2024-05-20 DIAGNOSIS — H6121 Impacted cerumen, right ear: Secondary | ICD-10-CM | POA: Diagnosis not present

## 2024-05-20 DIAGNOSIS — M810 Age-related osteoporosis without current pathological fracture: Secondary | ICD-10-CM | POA: Diagnosis not present

## 2024-05-20 DIAGNOSIS — I10 Essential (primary) hypertension: Secondary | ICD-10-CM | POA: Diagnosis not present

## 2024-05-25 DIAGNOSIS — M79642 Pain in left hand: Secondary | ICD-10-CM | POA: Diagnosis not present

## 2024-05-25 DIAGNOSIS — N1832 Chronic kidney disease, stage 3b: Secondary | ICD-10-CM | POA: Diagnosis not present

## 2024-08-03 ENCOUNTER — Encounter: Payer: Self-pay | Admitting: Podiatry

## 2024-08-03 ENCOUNTER — Ambulatory Visit: Admitting: Podiatry

## 2024-08-03 DIAGNOSIS — M79675 Pain in left toe(s): Secondary | ICD-10-CM

## 2024-08-03 DIAGNOSIS — M79674 Pain in right toe(s): Secondary | ICD-10-CM | POA: Diagnosis not present

## 2024-08-03 DIAGNOSIS — B351 Tinea unguium: Secondary | ICD-10-CM

## 2024-08-03 DIAGNOSIS — Q828 Other specified congenital malformations of skin: Secondary | ICD-10-CM

## 2024-08-03 MED ORDER — AMMONIUM LACTATE 12 % EX CREA: 1.0000 | TOPICAL_CREAM | CUTANEOUS | 0 refills | Status: AC | PRN

## 2024-08-03 NOTE — Progress Notes (Signed)
  Subjective:  Patient ID: Emily Tran, female    DOB: 1927/05/19,   MRN: [id number]  Chief Complaint  Patient presents with   Nail Problem    Check her feet.  Make them smooth.    89 y.o. female presents for concern of thickened elongated and painful nails that are difficult to trim. Requesting to have them trimmed today.  PCP:  Cleotilde Planas, MD    . Denies any other pedal complaints. Denies n/v/f/c.   History reviewed. No pertinent past medical history.  Objective:  Physical Exam: Vascular: DP/PT pulses 2/4 bilateral. CFT <3 seconds. Normal hair growth on digits. No edema.  Skin. No lacerations or abrasions bilateral feet. Nails 1-5 bilateral are thickened elongated and discolored with subungual debris. Hyperkeratotic cored lesio noted sub third metatarsal bilateral.  Musculoskeletal: MMT 5/5 bilateral lower extremities in DF, PF, Inversion and Eversion. Deceased ROM in DF of ankle joint.  Neurological: Sensation intact to light touch.   Assessment:   1. Pain due to onychomycosis of toenails of both feet   2. Porokeratosis        Plan:  Patient was evaluated and treated and all questions answered. -ABN signed -Mechanically debrided all nails 1-5 bilateral using sterile nail nipper and filed with dremel without incident  -Debrided hyperkeratotic tissue as courtesy today.  -Answered all patient questions -Patient to return  in 3 months for at risk foot care -Patient advised to call the office if any problems or questions arise in the meantime.   Asberry Failing, DPM

## 2024-08-03 NOTE — Addendum Note (Signed)
 Addended by: Kaicen Desena R on: 08/03/2024 09:05 AM   Modules accepted: Orders

## 2024-10-11 ENCOUNTER — Ambulatory Visit: Admitting: Podiatry

## 2024-10-11 ENCOUNTER — Encounter: Payer: Self-pay | Admitting: Podiatry

## 2024-10-11 DIAGNOSIS — B351 Tinea unguium: Secondary | ICD-10-CM | POA: Diagnosis not present

## 2024-10-11 DIAGNOSIS — M79674 Pain in right toe(s): Secondary | ICD-10-CM

## 2024-10-11 DIAGNOSIS — I739 Peripheral vascular disease, unspecified: Secondary | ICD-10-CM

## 2024-10-11 DIAGNOSIS — L84 Corns and callosities: Secondary | ICD-10-CM | POA: Diagnosis not present

## 2024-10-11 DIAGNOSIS — M79675 Pain in left toe(s): Secondary | ICD-10-CM

## 2024-10-11 NOTE — Progress Notes (Signed)
    Subjective:  Patient ID: Emily Tran, female    DOB: 23-Mar-1927,  MRN: [id number]  Emily Tran presents to clinic today for:  Chief Complaint  Patient presents with   RFC     RFC Non diabetic toenail trim and callus care.   Patient notes nails are thick and elongated, causing pain in shoe gear when ambulating.  She has painful corns and calluses bilateral.  Family member is with her today.  He notes that she gets anxious and tends to pull her feet away.  PCP is Cleotilde Planas, MD. last seen around 09/01/2024  History reviewed. No pertinent past medical history. No Known Allergies  Objective:  Emily Tran is a pleasant, but fairly nonverbal 89 y.o. female in NAD. AAO x 3.  Vascular Examination: Patient has palpable DP pulse, absent PT pulse bilateral.  Delayed capillary refill bilateral toes.  Sparse digital hair bilateral.  Proximal to distal cooling WNL bilateral.    Dermatological Examination: Interspaces are clear with no open lesions noted bilateral.  Skin is shiny and atrophic bilateral.  Nails are 3-48mm thick, with yellowish/brown discoloration, subungual debris and distal onycholysis x10.  There is pain with compression of nails x10.  There are hyperkeratotic lesions noted bilateral submet 2 and left submet 5.  Patient qualifies for at-risk foot care because of PVD.  Assessment/Plan: 1. Pain due to onychomycosis of toenails of both feet   2. Corns   3. PVD (peripheral vascular disease)    Mycotic nails x10 were sharply debrided with sterile nail nippers to decrease bulk and length.  She did pull her foot away several times during debridement, so the nails were not burred smooth for safety reasons.  Hyperkeratotic lesions x 3 were shaved with #312 blade.  Lesions are located above in the dermatological examination and are proximal to the toes.  Return in about 10 weeks (around 12/20/2024) for RFC.   Emily Tran, DPM, FACFAS [facility]      [address] [place], [place] [address]                Office [phone]  Fax [phone]

## 2025-01-18 ENCOUNTER — Ambulatory Visit: Admitting: Podiatry
